# Patient Record
Sex: Male | Born: 1935 | Race: White | Hispanic: No | Marital: Married | Smoking: Former smoker
Health system: Southern US, Community
[De-identification: ages and names within clinical notes are randomized; demographics above are authoritative.]

## PROBLEM LIST (undated history)

## (undated) DIAGNOSIS — E119 Type 2 diabetes mellitus without complications: Secondary | ICD-10-CM

## (undated) DIAGNOSIS — E785 Hyperlipidemia, unspecified: Secondary | ICD-10-CM

## (undated) DIAGNOSIS — I10 Essential (primary) hypertension: Secondary | ICD-10-CM

## (undated) HISTORY — DX: Type 2 diabetes mellitus without complications: E11.9

## (undated) HISTORY — DX: Essential (primary) hypertension: I10

## (undated) HISTORY — PX: OTHER SURGICAL HISTORY: SHX169

## (undated) HISTORY — DX: Hyperlipidemia, unspecified: E78.5

---

## 1999-07-13 HISTORY — PX: COLECTOMY: SHX59

## 2013-04-11 LAB — CHG GLYCOSYLATED HEMOGLOBIN TEST: A1c: 6.5

## 2013-11-01 LAB — CHG GLYCOSYLATED HEMOGLOBIN TEST: A1c: 6.8

## 2014-05-03 LAB — BASIC METABOLIC PANEL
BUN: 21 mg/dL (ref 4–21)
Creatinine: 1.6 mg/dL — AB (ref <=1.3)
Glucose: 130 mg/dL
Potassium: 4.9 mmol/L (ref 3.4–5.3)
Sodium: 141 mmol/L (ref 137–147)

## 2014-05-03 LAB — HEPATIC FUNCTION PANEL
ALT: 23 U/L (ref 10–40)
AST: 20 U/L (ref 14–40)

## 2014-05-03 LAB — COMPREHENSIVE METABOLIC PANEL: CALCIUM: 9.7 mg/dL

## 2014-11-07 LAB — CHG GLYCOSYLATED HEMOGLOBIN TEST: A1C: 9.1

## 2015-09-09 LAB — LIPID PANEL
CHOLESTEROL: 137 mg/dL (ref 0–200)
HDL: 32 mg/dL — AB (ref 35–70)
LDL CALC: 63 mg/dL
TRIGLYCERIDES: 212 mg/dL — AB (ref 40–160)

## 2015-09-09 LAB — HEMOGLOBIN A1C: Hemoglobin A1C: 9.1

## 2015-09-09 LAB — CBC AND DIFFERENTIAL
HEMOGLOBIN: 16.3 g/dL (ref 13.5–17.5)
PLATELETS: 147 10*3/uL — AB (ref 150–399)
WBC: 6.1 10*3/mL

## 2015-09-09 LAB — TSH: TSH: 1.33 u[IU]/mL (ref 0.41–5.90)

## 2015-09-10 ENCOUNTER — Encounter: Payer: Self-pay | Admitting: Family Medicine

## 2015-09-10 ENCOUNTER — Ambulatory Visit (INDEPENDENT_AMBULATORY_CARE_PROVIDER_SITE_OTHER): Payer: Medicare Other | Admitting: Family Medicine

## 2015-09-10 VITALS — BP 97/65 | HR 45 | Ht 65.75 in | Wt 197.0 lb

## 2015-09-10 DIAGNOSIS — N183 Chronic kidney disease, stage 3 unspecified: Secondary | ICD-10-CM | POA: Insufficient documentation

## 2015-09-10 DIAGNOSIS — E785 Hyperlipidemia, unspecified: Secondary | ICD-10-CM | POA: Diagnosis not present

## 2015-09-10 DIAGNOSIS — I10 Essential (primary) hypertension: Secondary | ICD-10-CM

## 2015-09-10 DIAGNOSIS — M1990 Unspecified osteoarthritis, unspecified site: Secondary | ICD-10-CM | POA: Insufficient documentation

## 2015-09-10 DIAGNOSIS — E1165 Type 2 diabetes mellitus with hyperglycemia: Secondary | ICD-10-CM | POA: Insufficient documentation

## 2015-09-10 DIAGNOSIS — E1122 Type 2 diabetes mellitus with diabetic chronic kidney disease: Secondary | ICD-10-CM | POA: Diagnosis not present

## 2015-09-10 DIAGNOSIS — IMO0002 Reserved for concepts with insufficient information to code with codable children: Secondary | ICD-10-CM | POA: Insufficient documentation

## 2015-09-10 LAB — BASIC METABOLIC PANEL
BUN: 22 mg/dL (ref 7–25)
CO2: 24 mmol/L (ref 20–31)
Calcium: 9.2 mg/dL (ref 8.6–10.3)
Chloride: 105 mmol/L (ref 98–110)
Creat: 1.64 mg/dL — ABNORMAL HIGH (ref 0.70–1.11)
Glucose, Bld: 134 mg/dL — ABNORMAL HIGH (ref 65–99)
Potassium: 4.6 mmol/L (ref 3.5–5.3)
Sodium: 139 mmol/L (ref 135–146)

## 2015-09-10 LAB — HEMOGLOBIN A1C
Hgb A1c MFr Bld: 7.2 % — ABNORMAL HIGH (ref <5.7)
Mean Plasma Glucose: 160 mg/dL — ABNORMAL HIGH (ref <117)

## 2015-09-10 MED ORDER — VITAMIN D (ERGOCALCIFEROL) 1.25 MG (50000 UNIT) PO CAPS: 50000.0000 [IU] | ORAL_CAPSULE | ORAL | Status: DC

## 2015-09-10 MED ORDER — INSULIN GLARGINE 100 UNIT/ML SOLOSTAR PEN: 42.0000 [IU] | PEN_INJECTOR | Freq: Every day | SUBCUTANEOUS | Status: DC

## 2015-09-10 MED ORDER — TRAMADOL HCL 50 MG PO TABS: 50.0000 mg | ORAL_TABLET | Freq: Three times a day (TID) | ORAL | Status: DC | PRN

## 2015-09-10 MED ORDER — GLYBURIDE 5 MG PO TABS: 10.0000 mg | ORAL_TABLET | Freq: Every day | ORAL | Status: DC

## 2015-09-10 MED ORDER — PRAVASTATIN SODIUM 40 MG PO TABS: 40.0000 mg | ORAL_TABLET | Freq: Every day | ORAL | Status: DC

## 2015-09-10 NOTE — Progress Notes (Signed)
CC: Matthew Coffey is a 81 y.o. male is here for Establish Care   Subjective: HPI:  Very pleasant 81 year old here to establish care  Reports a history of type 2 diabetes for an unknown matter of years. He is currently taking glyburide and Lantus without hypoglycemic episodes. No outside blood sugars to report other than a normal A1c 3 months ago.  Has a history of hyperlipidemia currently taking pravastatin on a daily basis. No right upper quadrant pain or myalgias.  History of essential hypertension currently taking lisinopril on a daily basis. When he checks his blood pressure at home is always in the normotensive range.  History of chronic kidney disease with most recent creatinine of 1.54 when checked in July of this year. Denies edema  His only complaint is a history of osteoarthritis in the hands and knees for matter of years. He is unable to take nonsteroidal anti-inflammatories due to his kidney disease. He wants to know if there is something else he can take to help with this pain. Mild in severity but present on a daily basis and interfering with his quality of life.  Review of Systems - General ROS: negative for - chills, fever, night sweats, weight gain or weight loss Ophthalmic ROS: negative for - decreased vision Psychological ROS: negative for - anxiety or depression ENT ROS: negative for - hearing change, nasal congestion, tinnitus or allergies Hematological and Lymphatic ROS: negative for - bleeding problems, bruising or swollen lymph nodes Breast ROS: negative Respiratory ROS: no cough, shortness of breath, or wheezing Cardiovascular ROS: no chest pain or dyspnea on exertion Gastrointestinal ROS: no abdominal pain, change in bowel habits, or black or bloody stools Genito-Urinary ROS: negative for - genital discharge, genital ulcers, incontinence or abnormal bleeding from genitals Musculoskeletal ROS: negative for - joint pain or muscle pain other than that described  above Neurological ROS: negative for - headaches or memory loss Dermatological ROS: negative for lumps, mole changes, rash and skin lesion changes  Past Medical History  Diagnosis Date  . Hyperlipidemia   . Hypertension   . Diabetes mellitus without complication (HCC)     No past surgical history on file. Family History  Problem Relation Age of Onset  . Heart attack Mother     Social History   Social History  . Marital Status: Married    Spouse Name: N/A  . Number of Children: N/A  . Years of Education: N/A   Occupational History  . Not on file.   Social History Main Topics  . Smoking status: Former Smoker    Quit date: 10/12/1973  . Smokeless tobacco: Never Used  . Alcohol Use: No  . Drug Use: No  . Sexual Activity: Not on file   Other Topics Concern  . Not on file   Social History Narrative  . No narrative on file     Objective: BP 97/65 mmHg  Pulse 45  Ht 5' 5.75" (1.67 m)  Wt 197 lb (89.359 kg)  BMI 32.04 kg/m2  Vital signs reviewed. General: Alert and Oriented, No Acute Distress HEENT: Pupils equal, round, reactive to light. Conjunctivae clear.  External ears unremarkable.  Moist mucous membranes. Lungs: Clear and comfortable work of breathing, speaking in full sentences without accessory muscle use. Cardiac: Regular rate and rhythm.  Neuro: CN II-XII grossly intact, gait normal. Extremities: No peripheral edema.  Strong peripheral pulses.  Mental Status: No depression, anxiety, nor agitation. Logical though process. Skin: Warm and dry. Assessment & Plan: Matthew Coffey was  seen today for establish care.  Diagnoses and all orders for this visit:  Type 2 diabetes mellitus with chronic kidney disease, without long-term current use of insulin, unspecified CKD stage (HCC) -     Hemoglobin A1c -     Basic Metabolic Panel (BMET)  Hyperlipidemia  Essential hypertension, benign  Chronic kidney disease, stage 3  Other orders -     traMADol (ULTRAM) 50 MG  tablet; Take 1 tablet (50 mg total) by mouth every 8 (eight) hours as needed. -     Insulin Glargine (LANTUS SOLOSTAR) 100 UNIT/ML Solostar Pen; Inject 42 Units into the skin daily at 10 pm. -     Vitamin D, Ergocalciferol, (DRISDOL) 50000 UNITS CAPS capsule; Take 1 capsule (50,000 Units total) by mouth every 7 (seven) days. Recheck Vitamin D in 3 Months -     pravastatin (PRAVACHOL) 40 MG tablet; Take 1 tablet (40 mg total) by mouth daily. -     glyBURIDE (DIABETA) 5 MG tablet; Take 2 tablets (10 mg total) by mouth daily with breakfast.   Type 2 diabetes: Due for A1c and checking renal function continue Lantus and glyburide pending results Hyperlipidemia: Requesting outside records for most recent lipid panel continue pravastatin pending results review Essential hypertension: He wants stop taking his lisinopril, will stop this temporarily and check his blood pressure and renal function in about a month. Osteoarthritis: Uncontrolled chronic condition begin as needed tramadol   Return in about 3 months (around 12/10/2015) for Sugar Follow Up.

## 2015-09-16 ENCOUNTER — Encounter: Payer: Self-pay | Admitting: Family Medicine

## 2015-09-16 DIAGNOSIS — Z905 Acquired absence of kidney: Secondary | ICD-10-CM | POA: Insufficient documentation

## 2015-11-05 ENCOUNTER — Encounter: Payer: Self-pay | Admitting: Family Medicine

## 2015-12-17 ENCOUNTER — Telehealth: Payer: Self-pay | Admitting: Family Medicine

## 2015-12-17 ENCOUNTER — Ambulatory Visit (INDEPENDENT_AMBULATORY_CARE_PROVIDER_SITE_OTHER): Payer: Medicare Other

## 2015-12-17 DIAGNOSIS — I739 Peripheral vascular disease, unspecified: Secondary | ICD-10-CM

## 2015-12-17 DIAGNOSIS — M25562 Pain in left knee: Secondary | ICD-10-CM | POA: Diagnosis not present

## 2015-12-17 NOTE — Telephone Encounter (Signed)
LEFT knee pain for two weeks

## 2015-12-18 ENCOUNTER — Telehealth: Payer: Self-pay | Admitting: Family Medicine

## 2015-12-18 NOTE — Telephone Encounter (Signed)
I spoke with pt as well before lunch

## 2015-12-18 NOTE — Telephone Encounter (Signed)
Dr. Onnie GrahamHommel/[facility] patient called adv that he was seen yesterday and did not receive a call about his xray I read him your notes from this morning and have set him up with Dr. Denyse Amassorey for tomorrow 12/19/15. You do not have to call him back about results. Thanks

## 2015-12-19 ENCOUNTER — Ambulatory Visit (INDEPENDENT_AMBULATORY_CARE_PROVIDER_SITE_OTHER): Payer: Medicare Other | Admitting: Family Medicine

## 2015-12-19 ENCOUNTER — Encounter: Payer: Self-pay | Admitting: Family Medicine

## 2015-12-19 VITALS — BP 152/83 | HR 92 | Wt 206.0 lb

## 2015-12-19 DIAGNOSIS — M25562 Pain in left knee: Secondary | ICD-10-CM | POA: Diagnosis not present

## 2015-12-19 NOTE — Patient Instructions (Signed)
Thank you for coming in today. Return in 4 weeks or so if not better.  Call or go to the ER if you develop a large red swollen joint with extreme pain or oozing puss.   Arthritis Arthritis is a term that is commonly used to refer to joint pain or joint disease. There are more than 100 types of arthritis. CAUSES The most common cause of this condition is wear and tear of a joint. Other causes include:  Gout.  Inflammation of a joint.  An infection of a joint.  Sprains and other injuries near the joint.  A drug reaction or allergic reaction. In some cases, the cause may not be known. SYMPTOMS The main symptom of this condition is pain in the joint with movement. Other symptoms include:  Redness, swelling, or stiffness at a joint.  Warmth coming from the joint.  Fever.  Overall feeling of illness. DIAGNOSIS This condition may be diagnosed with a physical exam and tests, including:  Blood tests.  Urine tests.  Imaging tests, such as MRI, X-rays, or a CT scan. Sometimes, fluid is removed from a joint for testing. TREATMENT Treatment for this condition may involve:  Treatment of the cause, if it is known.  Rest.  Raising (elevating) the joint.  Applying cold or hot packs to the joint.  Medicines to improve symptoms and reduce inflammation.  Injections of a steroid such as cortisone into the joint to help reduce pain and inflammation. Depending on the cause of your arthritis, you may need to make lifestyle changes to reduce stress on your joint. These changes may include exercising more and losing weight. HOME CARE INSTRUCTIONS Medicines  Take over-the-counter and prescription medicines only as told by your health care provider.  Do not take aspirin to relieve pain if gout is suspected. Activities  Rest your joint if told by your health care provider. Rest is important when your disease is active and your joint feels painful, swollen, or stiff.  Avoid  activities that make the pain worse. It is important to balance activity with rest.  Exercise your joint regularly with range-of-motion exercises as told by your health care provider. Try doing low-impact exercise, such as:  Swimming.  Water aerobics.  Biking.  Walking. Joint Care  If your joint is swollen, keep it elevated if told by your health care provider.  If your joint feels stiff in the morning, try taking a warm shower.  If directed, apply heat to the joint. If you have diabetes, do not apply heat without permission from your health care provider.  Put a towel between the joint and the hot pack or heating pad.  Leave the heat on the area for 20-30 minutes.  If directed, apply ice to the joint:  Put ice in a plastic bag.  Place a towel between your skin and the bag.  Leave the ice on for 20 minutes, 2-3 times per day.  Keep all follow-up visits as told by your health care provider. This is important. SEEK MEDICAL CARE IF:  The pain gets worse.  You have a fever. SEEK IMMEDIATE MEDICAL CARE IF:  You develop severe joint pain, swelling, or redness.  Many joints become painful and swollen.  You develop severe back pain.  You develop severe weakness in your leg.  You cannot control your bladder or bowels.   This information is not intended to replace advice given to you by your health care provider. Make sure you discuss any questions you have  with your health care provider.   Document Released: 11/05/2004 Document Revised: 06/19/2015 Document Reviewed: 12/24/2014 Elsevier Interactive Patient Education [employer].

## 2015-12-19 NOTE — Progress Notes (Signed)
Matthew Coffey is a 82 y.o. male who presents to [facility] today for left knee pain. Patient notes a 3 to four-week history of left knee pain. He denies any injury. No fevers chills nausea vomiting or diarrhea. He's tried some over-the-counter medicines which help a bit. His PCP obtained and a x-ray which showed some moderate DJD. Pain is located at the medial aspect of the knee and is worse with activity.   Past Medical History  Diagnosis Date  . Hyperlipidemia   . Hypertension   . Diabetes mellitus without complication (HCC)    No past surgical history on file. Social History  Substance Use Topics  . Smoking status: Former Smoker    Quit date: 10/12/1973  . Smokeless tobacco: Never Used  . Alcohol Use: No   family history includes Heart attack in his mother.  ROS:  No headache, visual changes, nausea, vomiting, diarrhea, constipation, dizziness, abdominal pain, skin rash, fevers, chills, night sweats, weight loss, swollen lymph nodes, body aches, joint swelling, muscle aches, chest pain, shortness of breath, mood changes, visual or auditory hallucinations.    Medications: Current Outpatient Prescriptions  Medication Sig Dispense Refill  . glyBURIDE (DIABETA) 5 MG tablet Take 2 tablets (10 mg total) by mouth daily with breakfast. 120 tablet 1  . Insulin Glargine (LANTUS SOLOSTAR) 100 UNIT/ML Solostar Pen Inject 42 Units into the skin daily at 10 pm. 5 pen 4  . pravastatin (PRAVACHOL) 40 MG tablet Take 1 tablet (40 mg total) by mouth daily. 90 tablet 3  . traMADol (ULTRAM) 50 MG tablet Take 1 tablet (50 mg total) by mouth every 8 (eight) hours as needed. 30 tablet 0  . Vitamin D, Ergocalciferol, (DRISDOL) 50000 UNITS CAPS capsule Take 1 capsule (50,000 Units total) by mouth every 7 (seven) days. Recheck Vitamin D in 3 Months 12 capsule 0   No current facility-administered medications for this visit.   No Known Allergies   Exam:    BP 152/83 mmHg  Pulse 92  Wt 206 lb (93.441 kg) General: Well Developed, well nourished, and in no acute distress.  Neuro/Psych: Alert and oriented x3, extra-ocular muscles intact, able to move all 4 extremities, sensation grossly intact. Skin: Warm and dry, no rashes noted.  Respiratory: Not using accessory muscles, speaking in full sentences, trachea midline.  Cardiovascular: Pulses palpable, no extremity edema. Abdomen: Does not appear distended. MSK: Left knee is relatively normal appearing. Mild effusion.  Nontender.  Normal motion. Stable ligamentous exam. Negative McMurray's testing.  Procedure: Real-time Ultrasound Guided Injection of left knee  Device: GE Logiq E  Images permanently stored and available for review in the ultrasound unit. Verbal informed consent obtained. Discussed risks and benefits of procedure. Warned about infection bleeding damage to structures skin hypopigmentation and fat atrophy among others. Patient expresses understanding and agreement Time-out conducted.  Noted no overlying erythema, induration, or other signs of local infection.  Skin prepped in a sterile fashion.  Local anesthesia: Topical Ethyl chloride.  With sterile technique and under real time ultrasound guidance: 80 mg of Kenalog and 4 mL of Marcaine injected easily.  Completed without difficulty  Pain immediately resolved suggesting accurate placement of the medication.  Advised to call if fevers/chills, erythema, induration, drainage, or persistent bleeding.  Images permanently stored and available for review in the ultrasound unit.  Impression: Technically successful ultrasound guided injection.    No results found for this or any previous visit (from the past 24 hour(s)). Dg Knee  Complete 4 Views Left  12/17/2015  CLINICAL DATA:  Left knee pain. No known injury. Initial evaluation . EXAM: LEFT KNEE - COMPLETE 4+ VIEW COMPARISON:  No recent prior. FINDINGS: Twisted lateral  femoral condyle most likely from old ligamentous injury. Adjacent soft tissue calcification, most likely dystrophic. No other focal bony abnormality identified. Mild patellofemoral and medial compartment degenerative change. Small knee joint effusion cannot be excluded. Peripheral vascular calcification . IMPRESSION: 1. Small bony density noted adjacent to the lateral femoral condyle most likely from old ligamentous injury. Adjacent soft tissue calcification most likely dystrophic . 2. Mild patellofemoral and medial compartment degenerative change. No acute bony abnormality . Small knee joint effusion cannot be excluded. 3.  Peripheral vascular disease. Electronically Signed   By: Maisie Fushomas  Register   On: 12/17/2015 17:09     Please see individual assessment and plan sections.

## 2015-12-19 NOTE — Assessment & Plan Note (Signed)
Due to DJD. Steroid injection today. Return in a few weeks if not better.

## 2016-02-03 ENCOUNTER — Telehealth: Payer: Self-pay

## 2016-02-03 ENCOUNTER — Other Ambulatory Visit: Payer: Self-pay

## 2016-02-03 MED ORDER — GLIPIZIDE 5 MG PO TABS: 5.0000 mg | ORAL_TABLET | Freq: Two times a day (BID) | ORAL | Status: DC

## 2016-02-03 MED ORDER — INSULIN PEN NEEDLE 31G X 5 MM MISC: Status: DC

## 2016-02-03 MED ORDER — AMBULATORY NON FORMULARY MEDICATION: Status: DC

## 2016-02-03 MED ORDER — PRAVASTATIN SODIUM 40 MG PO TABS: 40.0000 mg | ORAL_TABLET | Freq: Every day | ORAL | Status: DC

## 2016-02-03 NOTE — Telephone Encounter (Signed)
rx faxed

## 2016-02-03 NOTE — Telephone Encounter (Signed)
[place], Rx placed in in-box ready for pickup/faxing.  

## 2016-02-03 NOTE — Telephone Encounter (Signed)
Pt is asking for needles and test strips (one touch ultra) to go to [facility].

## 2016-02-17 ENCOUNTER — Telehealth: Payer: Self-pay | Admitting: Family Medicine

## 2016-02-17 MED ORDER — INSULIN GLARGINE 100 UNIT/ML SOLOSTAR PEN: 42.0000 [IU] | PEN_INJECTOR | Freq: Every day | SUBCUTANEOUS | Status: DC

## 2016-02-17 NOTE — Telephone Encounter (Signed)
Refill req 

## 2016-04-07 ENCOUNTER — Ambulatory Visit (INDEPENDENT_AMBULATORY_CARE_PROVIDER_SITE_OTHER): Payer: Medicare Other | Admitting: Family Medicine

## 2016-04-07 ENCOUNTER — Encounter: Payer: Self-pay | Admitting: Family Medicine

## 2016-04-07 VITALS — BP 125/76 | HR 90 | Wt 202.0 lb

## 2016-04-07 DIAGNOSIS — E1122 Type 2 diabetes mellitus with diabetic chronic kidney disease: Secondary | ICD-10-CM

## 2016-04-07 DIAGNOSIS — I10 Essential (primary) hypertension: Secondary | ICD-10-CM | POA: Diagnosis not present

## 2016-04-07 DIAGNOSIS — L309 Dermatitis, unspecified: Secondary | ICD-10-CM

## 2016-04-07 LAB — POCT GLYCOSYLATED HEMOGLOBIN (HGB A1C): Hemoglobin A1C: 7.7

## 2016-04-07 MED ORDER — VITAMIN D (ERGOCALCIFEROL) 1.25 MG (50000 UNIT) PO CAPS: 50000.0000 [IU] | ORAL_CAPSULE | ORAL | Status: DC

## 2016-04-07 MED ORDER — TRIAMCINOLONE ACETONIDE 0.1 % EX CREA: TOPICAL_CREAM | CUTANEOUS | Status: DC

## 2016-04-07 NOTE — Progress Notes (Signed)
CC: Matthew PalmerClaude Coffey is a 82 y.o. male is here for No chief complaint on file.   Subjective: HPI:  Follow-up type 2 diabetes: No outside blood sugars to report. He is taking Lantus nightly at a dose of 42 units. He is also taking glipizide with 100% compliance. Denies polyuria polyphagia polydipsia or vision disturbance. He is yet to find an eye doctor in the [place] area.  Follow-up essential hypertension: Currently not taking any anti-hyperglycemics. He denies any chest pain shortness of breath orthopnea nor peripheral edema.  His only real complaint today is bilateral foot tenderness. It's localized to the skin. It seems to be worse at areas that are flaking, dry and cracking. He denies any fevers, chills or skin changes elsewhere. No interventions as of yet symptoms were present for matter months     Review Of Systems Outlined In HPI  Past Medical History  Diagnosis Date  . Hyperlipidemia   . Hypertension   . Diabetes mellitus without complication (HCC)     No past surgical history on file. Family History  Problem Relation Age of Onset  . Heart attack Mother     Social History   Social History  . Marital Status: Married    Spouse Name: N/A  . Number of Children: N/A  . Years of Education: N/A   Occupational History  . Not on file.   Social History Main Topics  . Smoking status: Former Smoker    Quit date: 10/12/1973  . Smokeless tobacco: Never Used  . Alcohol Use: No  . Drug Use: No  . Sexual Activity: Not on file   Other Topics Concern  . Not on file   Social History Narrative     Objective: BP 125/76 mmHg  Pulse 90  Wt 202 lb (91.627 kg)  General: Alert and Oriented, No Acute Distress HEENT: Pupils equal, round, reactive to light. Conjunctivae clear.  Moist mucous membranes Lungs: Clear to auscultation bilaterally, no wheezing/ronchi/rales.  Comfortable work of breathing. Good air movement. Cardiac: Regular rate and rhythm. Normal S1/S2.  No  murmurs, rubs, nor gallops.   Extremities: No peripheral edema.  Strong peripheral pulses.  Mental Status: No depression, anxiety, nor agitation. Skin: Warm and dry. The bottom and sides of both feet are moderately dry with some mild flaking and cracking but no signs of infection.  Assessment & Plan: Diagnoses and all orders for this visit:  Type 2 diabetes mellitus with chronic kidney disease, without long-term current use of insulin, unspecified CKD stage (HCC) -     POCT glycosylated hemoglobin (Hb A1C)  Essential hypertension, benign  Dermatitis of foot -     triamcinolone cream (KENALOG) 0.1 %; Apply to FEET  twice a day for up to two weeks, avoid face.  Other orders -     Vitamin D, Ergocalciferol, (DRISDOL) 50000 units CAPS capsule; Take 1 capsule (50,000 Units total) by mouth every 7 (seven) days.   Type 2 diabetes: A1c of 7.7, controlled, goal A1c of less than 8. Continue glipizide and Lantus Essential hypertension: Controlled with diet and sodium restriction Dermatitis of the foot: Start triamcinolone cream for the next 2 weeks. He would like a refill on vitamin D, he tells me he has to take this on a weekly basis to keep his levels up.    Return in about 3 months (around 07/08/2016) for Sugar.

## 2016-05-22 ENCOUNTER — Telehealth: Payer: Self-pay | Admitting: Family Medicine

## 2016-05-22 MED ORDER — MOMETASONE FUROATE 0.1 % EX CREA: 1.0000 "application " | TOPICAL_CREAM | Freq: Every day | CUTANEOUS | 1 refills | Status: DC

## 2016-05-22 NOTE — Telephone Encounter (Signed)
Refill req 

## 2016-06-26 ENCOUNTER — Other Ambulatory Visit: Payer: Self-pay

## 2016-06-26 ENCOUNTER — Telehealth: Payer: Self-pay

## 2016-06-26 MED ORDER — LANTUS SOLOSTAR 100 UNIT/ML ~~LOC~~ SOPN: 42.0000 [IU] | PEN_INJECTOR | Freq: Every day | SUBCUTANEOUS | 0 refills | Status: DC

## 2016-06-26 MED ORDER — LANTUS SOLOSTAR 100 UNIT/ML ~~LOC~~ SOPN: 42.0000 [IU] | PEN_INJECTOR | Freq: Every day | SUBCUTANEOUS | 2 refills | Status: DC

## 2016-06-26 NOTE — Telephone Encounter (Signed)
Rx sent to [facility].  Pt notified and was advised to make follow up appointment. He verbalized understanding.

## 2016-06-26 NOTE — Telephone Encounter (Signed)
Pt called stating that he has an appointment scheduled to switch care [facility] to you on the 27th. He is completely out of Lantus and was expecting a refill for a 3 month supply to be sent to his pharmacy earlier this week. Advised pt that I would discuss matter with you and that while he may be able to get a refill to last until his appointment, there is no gaurentee that he will get a 3 month supply until after his visit. Pt stated that he will be charged $75 for one month and can get 3 months for the same price. Advised pt that this would be discussed and he will be contacted back. Please advise.

## 2016-06-26 NOTE — Telephone Encounter (Signed)
Okay to fill for 3 months, if he doesn't show up for his appointment will not do any further refills

## 2016-07-08 ENCOUNTER — Ambulatory Visit (INDEPENDENT_AMBULATORY_CARE_PROVIDER_SITE_OTHER): Payer: Medicare Other | Admitting: Osteopathic Medicine

## 2016-07-08 ENCOUNTER — Encounter: Payer: Self-pay | Admitting: Osteopathic Medicine

## 2016-07-08 VITALS — BP 120/76 | HR 93 | Ht 68.0 in | Wt 204.0 lb

## 2016-07-08 DIAGNOSIS — R0789 Other chest pain: Secondary | ICD-10-CM

## 2016-07-08 DIAGNOSIS — E1122 Type 2 diabetes mellitus with diabetic chronic kidney disease: Secondary | ICD-10-CM | POA: Diagnosis not present

## 2016-07-08 LAB — POCT GLYCOSYLATED HEMOGLOBIN (HGB A1C): HEMOGLOBIN A1C: 8.4

## 2016-07-08 NOTE — Progress Notes (Signed)
HPI: Matthew Coffey is a 82 y.o. male  who presents to [facility] [place] today, 07/08/16,  for chief complaint of:  Chief Complaint  Patient presents with  . Establish Care    diabetes   Daughter spoke with me privately prior to the visit, she wants to be sure that I ask about some chest pain the patient was experiencing a few days ago.  Diabetes:Lantus 40 units daily, A1C as below. Eats pretty much whatever he wants but no major sweet tooth.   Chest pain: woek up with some chest discomfort Neck before last. It passed on its own. Patient reports occasional short winded on exertion but no chest pain on exertion or at rest. No history of heart problems that he knows of.   Patient is accompanied by daughter and wife who assists with history-taking.   Past medical, surgical, social and family history reviewed: Past Medical History:  Diagnosis Date  . Diabetes mellitus without complication (HCC)   . Hyperlipidemia   . Hypertension    No past surgical history on file. Social History  Substance Use Topics  . Smoking status: Former Smoker    Quit date: 10/12/1973  . Smokeless tobacco: Never Used  . Alcohol use No   Family History  Problem Relation Age of Onset  . Heart attack Mother      Current medication list and allergy/intolerance information reviewed:   Current Outpatient Prescriptions  Medication Sig Dispense Refill  . AMBULATORY NON FORMULARY MEDICATION Glucometer testing strips (One Touch Ultra): Use to check blood sugar up to three times a day. Dx: Insulin Dependent Type 2 Diabetes, E11.9,Z79.4 50 Units 11  . glipiZIDE (GLUCOTROL) 5 MG tablet Take 1 tablet (5 mg total) by mouth 2 (two) times daily before a meal. 180 tablet 1  . Insulin Pen Needle 31G X 5 MM MISC Use fresh needle to inject insulin subcutaneously daily. Dx: Insulin Dependent Type 2 Diabetes E11.9, Z79.4 50 each 11  . LANTUS SOLOSTAR 100 UNIT/ML Solostar Pen Inject 42 Units into  the skin daily. 15 pen 0  . mometasone (ELOCON) 0.1 % cream Apply 1 application topically daily. As needed for skin irritation 45 g 1  . pravastatin (PRAVACHOL) 40 MG tablet Take 1 tablet (40 mg total) by mouth daily. 90 tablet 3  . traMADol (ULTRAM) 50 MG tablet Take 1 tablet (50 mg total) by mouth every 8 (eight) hours as needed. 30 tablet 0  . triamcinolone cream (KENALOG) 0.1 % Apply to FEET  twice a day for up to two weeks, avoid face. 80 g 0  . Vitamin D, Ergocalciferol, (DRISDOL) 50000 units CAPS capsule Take 1 capsule (50,000 Units total) by mouth every 7 (seven) days. 12 capsule 1   No current facility-administered medications for this visit.    No Known Allergies    Review of Systems:  Constitutional:   No significant fatigue.   HEENT: No  headache, no vision change,  Cardiac: +chest pain, No  pressure, No palpitations, No  Orthopnea  Respiratory:  No  shortness of breath. No  Cough  Gastrointestinal: No  abdominal pain, No  nausea,    Musculoskeletal: No new myalgia/arthralgia  Neurologic: No  weakness, No  dizziness,  Psychiatric: No  concerns with depression, No  concerns with anxiety  Exam:  BP 120/76   Pulse 93   Ht 5\' 8"  (1.727 m)   Wt 204 lb (92.5 kg)   BMI 31.02 kg/m   Constitutional: VS see above.  General Appearance: alert, well-developed, well-nourished, NAD  Eyes: Normal lids and conjunctive, non-icteric sclera  Ears, Nose, Mouth, Throat: MMM, Normal external inspection ears/nares/mouth/lips/gums. TM normal bilaterally. Pharynx/tonsils no erythema, no exudate. Nasal mucosa normal.   Neck: No masses, trachea midline. No thyroid enlargement. No tenderness/mass appreciated. No lymphadenopathy  Respiratory: Normal respiratory effort. no wheeze, no rhonchi, no rales  Cardiovascular: S1/S2 normal, no murmur, no rub/gallop auscultated. RRR. No lower extremity edema.   Gastrointestinal: Nontender, no masses. No hepatomegaly, no splenomegaly. No hernia  appreciated. Bowel sounds normal. Rectal exam deferred.   Musculoskeletal: Gait normal. No clubbing/cyanosis of digits.   Neurological:Normal balance/coordination. No tremor.   Skin: warm, dry, intact. No concerning nevi or subq nodules on limited exam.    Psychiatric: Normal judgment/insight. Normal mood and affect. Oriented x3.    Results for orders placed or performed in visit on 07/08/16 (from the past 72 hour(s))  POCT HgB A1C     Status: None   Collection Time: 07/08/16  2:12 PM  Result Value Ref Range   Hemoglobin A1C 8.4       ASSESSMENT/PLAN:    Type 2 diabetes mellitus with chronic kidney disease, without long-term current use of insulin, unspecified CKD stage (HCC) - Plan: POCT HgB A1C, Microalbumin, urine, CBC with Differential/Platelet, COMPLETE METABOLIC PANEL WITH GFR, Lipid panel, TSH  Chest discomfort - no prior EKG available for comparison, no chest pain/angina sometimes otherwise. EKG shows some right bundle branch block, possible old inferior infarcts with left axis deviation, but no ST or T changes concerning for acute ischemia/infarct. ER precautions were reviewed  Patient Instructions  Plan for diabetes:  Increase insulin to 43 units daily, can increase up to 45 as well as sugars are staying above 120 fasting morning levels.  We are getting other blood work today for routine screening.    Visit summary with medication list and pertinent instructions was printed for patient to review. All questions at time of visit were answered - patient instructed to contact office with any additional concerns. ER/RTC precautions were reviewed with the patient. Follow-up plan: Return in about 3 months (around 10/07/2016) for diabetes followup.

## 2016-07-08 NOTE — Patient Instructions (Signed)
Plan for diabetes:  Increase insulin to 43 units daily, can increase up to 45 as well as sugars are staying above 120 fasting morning levels.  We are getting other blood work today for routine screening.

## 2016-07-09 LAB — LIPID PANEL
CHOL/HDL RATIO: 3.5 ratio (ref <=5.0)
Cholesterol: 123 mg/dL — ABNORMAL LOW (ref 125–200)
HDL: 35 mg/dL — AB (ref >=40)
LDL Cholesterol: 57 mg/dL (ref <130)
TRIGLYCERIDES: 155 mg/dL — AB (ref <150)
VLDL: 31 mg/dL — ABNORMAL HIGH (ref <30)

## 2016-07-09 LAB — CBC WITH DIFFERENTIAL/PLATELET
BASOS ABS: 0 {cells}/uL (ref 0–200)
Basophils Relative: 0 %
EOS PCT: 4 %
Eosinophils Absolute: 300 cells/uL (ref 15–500)
HCT: 48.3 % (ref 38.5–50.0)
HEMOGLOBIN: 16.4 g/dL (ref 13.2–17.1)
LYMPHS ABS: 2175 {cells}/uL (ref 850–3900)
Lymphocytes Relative: 29 %
MCH: 30.7 pg (ref 27.0–33.0)
MCHC: 34 g/dL (ref 32.0–36.0)
MCV: 90.4 fL (ref 80.0–100.0)
MONOS PCT: 9 %
MPV: 10.7 fL (ref 7.5–12.5)
Monocytes Absolute: 675 cells/uL (ref 200–950)
NEUTROS ABS: 4350 {cells}/uL (ref 1500–7800)
NEUTROS PCT: 58 %
PLATELETS: 146 10*3/uL (ref 140–400)
RBC: 5.34 MIL/uL (ref 4.20–5.80)
RDW: 14.1 % (ref 11.0–15.0)
WBC: 7.5 10*3/uL (ref 3.8–10.8)

## 2016-07-09 LAB — COMPLETE METABOLIC PANEL WITH GFR
ALBUMIN: 4.1 g/dL (ref 3.6–5.1)
ALK PHOS: 44 U/L (ref 40–115)
ALT: 43 U/L (ref 9–46)
AST: 29 U/L (ref 10–35)
BILIRUBIN TOTAL: 0.8 mg/dL (ref 0.2–1.2)
BUN: 20 mg/dL (ref 7–25)
CO2: 29 mmol/L (ref 20–31)
Calcium: 9.6 mg/dL (ref 8.6–10.3)
Chloride: 103 mmol/L (ref 98–110)
Creat: 1.64 mg/dL — ABNORMAL HIGH (ref 0.70–1.11)
GFR, EST AFRICAN AMERICAN: 44 mL/min — AB (ref >=60)
GFR, EST NON AFRICAN AMERICAN: 38 mL/min — AB (ref >=60)
Glucose, Bld: 223 mg/dL — ABNORMAL HIGH (ref 65–99)
POTASSIUM: 4.7 mmol/L (ref 3.5–5.3)
Sodium: 140 mmol/L (ref 135–146)
TOTAL PROTEIN: 6.7 g/dL (ref 6.1–8.1)

## 2016-07-09 LAB — TSH: TSH: 1.56 mIU/L (ref 0.40–4.50)

## 2016-07-11 DIAGNOSIS — I214 Non-ST elevation (NSTEMI) myocardial infarction: Secondary | ICD-10-CM | POA: Insufficient documentation

## 2016-07-11 DIAGNOSIS — D696 Thrombocytopenia, unspecified: Secondary | ICD-10-CM | POA: Insufficient documentation

## 2016-07-11 DIAGNOSIS — I249 Acute ischemic heart disease, unspecified: Secondary | ICD-10-CM | POA: Insufficient documentation

## 2016-07-13 NOTE — Addendum Note (Signed)
Addended by: Wyline BeadyMCCRIMMON, ANDREA C on: 07/13/2016 04:14 PM   Modules accepted: Orders

## 2016-07-16 ENCOUNTER — Telehealth: Payer: Self-pay

## 2016-07-16 NOTE — Telephone Encounter (Signed)
Patient called requested a refill for Metformin 1000 mcg. Please advise because I don't see this medication in patient chart. Kamareon Sciandra,CMA

## 2016-07-16 NOTE — Telephone Encounter (Signed)
You have, I don't see this anywhere on his list or on his previous medications. Is he sure he does not need glyburide? Either way, he probably should be on metformin but that he told me at his visit that this medicine caused him problems in the past. Let's get some clarification on this.

## 2016-07-17 NOTE — Telephone Encounter (Signed)
It was for his wife which was taken care of. Bralyn Espino,CMA

## 2016-07-21 ENCOUNTER — Encounter: Payer: Self-pay | Admitting: Osteopathic Medicine

## 2016-07-21 ENCOUNTER — Ambulatory Visit (INDEPENDENT_AMBULATORY_CARE_PROVIDER_SITE_OTHER): Payer: Medicare Other | Admitting: Osteopathic Medicine

## 2016-07-21 VITALS — BP 115/59 | HR 76 | Ht 68.0 in | Wt 202.0 lb

## 2016-07-21 DIAGNOSIS — N183 Chronic kidney disease, stage 3 unspecified: Secondary | ICD-10-CM

## 2016-07-21 DIAGNOSIS — I2510 Atherosclerotic heart disease of native coronary artery without angina pectoris: Secondary | ICD-10-CM

## 2016-07-21 DIAGNOSIS — I10 Essential (primary) hypertension: Secondary | ICD-10-CM

## 2016-07-21 DIAGNOSIS — Z794 Long term (current) use of insulin: Secondary | ICD-10-CM

## 2016-07-21 DIAGNOSIS — IMO0002 Reserved for concepts with insufficient information to code with codable children: Secondary | ICD-10-CM

## 2016-07-21 DIAGNOSIS — E1159 Type 2 diabetes mellitus with other circulatory complications: Secondary | ICD-10-CM | POA: Diagnosis not present

## 2016-07-21 DIAGNOSIS — E1165 Type 2 diabetes mellitus with hyperglycemia: Secondary | ICD-10-CM

## 2016-07-21 MED ORDER — ATORVASTATIN CALCIUM 80 MG PO TABS: 80.0000 mg | ORAL_TABLET | Freq: Every day | ORAL | 3 refills | Status: DC

## 2016-07-21 NOTE — Patient Instructions (Addendum)
We are going to reduce the dose of the metoprolol, use up the tablets you have, half tablets and due for new prescription I will call in lower dose medication.  To keep your heart functioning as well as you cannula on the following medications: Metoprolol - reduce his blood pressure and stress on the heart Isosorbide - helps dilated blood vessels to increase blood flow to the heart tissue Atorvastatin - cholesterol medicine that helps prevent arterial plaque from forming and can stabilize or reduce plaque that is already present Aspirin - mild blood thinner to help deliver blood to the heart tissue Nitroglycerin tablets - take as needed for severe chest pain  See below for dietary modifications to promote heart health and help control diabetes.    Diabetes Mellitus and Food It is important for you to manage your blood sugar (glucose) level. Your blood glucose level can be greatly affected by what you eat. Eating healthier foods in the appropriate amounts throughout the day at about the same time each day will help you control your blood glucose level. It can also help slow or prevent worsening of your diabetes mellitus. Healthy eating may even help you improve the level of your blood pressure and reach or maintain a healthy weight.  General recommendations for healthful eating and cooking habits include:  Eating meals and snacks regularly. Avoid going long periods of time without eating to lose weight.  Eating a diet that consists mainly of plant-based foods, such as fruits, vegetables, nuts, legumes, and whole grains.  Using low-heat cooking methods, such as baking, instead of high-heat cooking methods, such as deep frying. Work with your dietitian to make sure you understand how to use the Nutrition Facts information on food labels. HOW CAN FOOD AFFECT ME? Carbohydrates Carbohydrates affect your blood glucose level more than any other type of food. Your dietitian will help you determine  how many carbohydrates to eat at each meal and teach you how to count carbohydrates. Counting carbohydrates is important to keep your blood glucose at a healthy level, especially if you are using insulin or taking certain medicines for diabetes mellitus. Alcohol Alcohol can cause sudden decreases in blood glucose (hypoglycemia), especially if you use insulin or take certain medicines for diabetes mellitus. Hypoglycemia can be a life-threatening condition. Symptoms of hypoglycemia (sleepiness, dizziness, and disorientation) are similar to symptoms of having too much alcohol.  If your health care provider has given you approval to drink alcohol, do so in moderation and use the following guidelines:  Women should not have more than one drink per day, and men should not have more than two drinks per day. One drink is equal to:  12 oz of beer.  5 oz of wine.  1 oz of hard liquor.  Do not drink on an empty stomach.  Keep yourself hydrated. Have water, diet soda, or unsweetened iced tea.  Regular soda, juice, and other mixers might contain a lot of carbohydrates and should be counted. WHAT FOODS ARE NOT RECOMMENDED? As you make food choices, it is important to remember that all foods are not the same. Some foods have fewer nutrients per serving than other foods, even though they might have the same number of calories or carbohydrates. It is difficult to get your body what it needs when you eat foods with fewer nutrients. Examples of foods that you should avoid that are high in calories and carbohydrates but low in nutrients include:  Trans fats (most processed foods list trans fats  on the Nutrition Facts label).  Regular soda.  Juice.  Candy.  Sweets, such as cake, pie, doughnuts, and cookies.  Fried foods. WHAT FOODS CAN I EAT? Eat nutrient-rich foods, which will nourish your body and keep you healthy. The food you should eat also will depend on several factors, including:  The calories  you need.  The medicines you take.  Your weight.  Your blood glucose level.  Your blood pressure level.  Your cholesterol level. You should eat a variety of foods, including:  Protein.  Lean cuts of meat.  Proteins low in saturated fats, such as fish, egg whites, and beans. Avoid processed meats.  Fruits and vegetables.  Fruits and vegetables that may help control blood glucose levels, such as apples, mangoes, and yams.  Dairy products.  Choose fat-free or low-fat dairy products, such as milk, yogurt, and cheese.  Grains, bread, pasta, and rice.  Choose whole grain products, such as multigrain bread, whole oats, and brown rice. These foods may help control blood pressure.  Fats.  Foods containing healthful fats, such as nuts, avocado, olive oil, canola oil, and fish. DOES EVERYONE WITH DIABETES MELLITUS HAVE THE SAME MEAL PLAN? Because every person with diabetes mellitus is different, there is not one meal plan that works for everyone. It is very important that you meet with a dietitian who will help you create a meal plan that is just right for you.   This information is not intended to replace advice given to you by your health care provider. Make sure you discuss any questions you have with your health care provider.   Document Released: 06/25/2005 Document Revised: 10/19/2014 Document Reviewed: 08/25/2013 Elsevier Interactive Patient Education 2016 [employer].       Mediterranean Diet: Why follow it? Research shows. . Those who follow the Mediterranean diet have a reduced risk of heart disease  . The diet is associated with a reduced incidence of Parkinson's and Alzheimer's diseases . People following the diet may have longer life expectancies and lower rates of chronic diseases  . The Dietary Guidelines for Americans recommends the Mediterranean diet as an eating plan to promote health and prevent disease  What Is the Mediterranean Diet?  . Healthy eating  plan based on typical foods and recipes of Mediterranean-style cooking . The diet is primarily a plant based diet; these foods should make up a majority of meals   Starches - Plant based foods should make up a majority of meals - They are an important sources of vitamins, minerals, energy, antioxidants, and fiber - Choose whole grains, foods high in fiber and minimally processed items  - Typical grain sources include wheat, oats, barley, corn, brown rice, bulgar, farro, millet, polenta, couscous  - Various types of beans include chickpeas, lentils, fava beans, black beans, white beans   Fruits  Veggies - Large quantities of antioxidant rich fruits & veggies; 6 or more servings  - Vegetables can be eaten raw or lightly drizzled with oil and cooked  - Vegetables common to the traditional Mediterranean Diet include: artichokes, arugula, beets, broccoli, brussel sprouts, cabbage, carrots, celery, collard greens, cucumbers, eggplant, kale, leeks, lemons, lettuce, mushrooms, okra, onions, peas, peppers, potatoes, pumpkin, radishes, rutabaga, shallots, spinach, sweet potatoes, turnips, zucchini - Fruits common to the Mediterranean Diet include: apples, apricots, avocados, cherries, clementines, dates, figs, grapefruits, grapes, melons, nectarines, oranges, peaches, pears, pomegranates, strawberries, tangerines  Fats - Replace butter and margarine with healthy oils, such as olive oil, canola oil, and tahini  -  Limit nuts to no more than a handful a day  - Nuts include walnuts, almonds, pecans, pistachios, pine nuts  - Limit or avoid candied, honey roasted or heavily salted nuts - Olives are central to the Mediterranean diet - can be eaten whole or used in a variety of dishes   Meats Protein - Limiting red meat: no more than a few times a month - When eating red meat: choose lean cuts and keep the portion to the size of deck of cards - Eggs: approx. 0 to 4 times a week  - Fish and lean poultry: at least 2  a week  - Healthy protein sources include, chicken, [place], lean beef, lamb - Increase intake of seafood such as tuna, salmon, trout, mackerel, shrimp, scallops - Avoid or limit high fat processed meats such as sausage and bacon  Dairy - Include moderate amounts of low fat dairy products  - Focus on healthy dairy such as fat free yogurt, skim milk, low or reduced fat cheese - Limit dairy products higher in fat such as whole or 2% milk, cheese, ice cream  Alcohol - Moderate amounts of red wine is ok  - No more than 5 oz daily for women (all ages) and men older than age 80  - No more than 10 oz of wine daily for men younger than 5465  Other - Limit sweets and other desserts  - Use herbs and spices instead of salt to flavor foods  - Herbs and spices common to the traditional Mediterranean Diet include: basil, bay leaves, chives, cloves, cumin, fennel, garlic, lavender, marjoram, mint, oregano, parsley, pepper, rosemary, sage, savory, sumac, tarragon, thyme   It's not just a diet, it's a lifestyle:  . The Mediterranean diet includes lifestyle factors typical of those in the region  . Foods, drinks and meals are best eaten with others and savored . Daily physical activity is important for overall good health . This could be strenuous exercise like running and aerobics . This could also be more leisurely activities such as walking, housework, yard-work, or taking the stairs . Moderation is the key; a balanced and healthy diet accommodates most foods and drinks . Consider portion sizes and frequency of consumption of certain foods   Meal Ideas & Options:  . Breakfast:  o Whole wheat toast or whole wheat English muffins with peanut butter & hard boiled egg o Steel cut oats topped with apples & cinnamon and skim milk  o Fresh fruit: banana, strawberries, melon, berries, peaches  o Smoothies: strawberries, bananas, greek yogurt, peanut butter o Low fat greek yogurt with blueberries and granola   o Egg white omelet with spinach and mushrooms o Breakfast couscous: whole wheat couscous, apricots, skim milk, cranberries  . Sandwiches:  o Hummus and grilled vegetables (peppers, zucchini, squash) on whole wheat bread   o Grilled chicken on whole wheat pita with lettuce, tomatoes, cucumbers or tzatziki  o Tuna salad on whole wheat bread: tuna salad made with greek yogurt, olives, red peppers, capers, green onions o Garlic rosemary lamb pita: lamb sauted with garlic, rosemary, salt & pepper; add lettuce, cucumber, greek yogurt to pita - flavor with lemon juice and black pepper  . Seafood:  o Mediterranean grilled salmon, seasoned with garlic, basil, parsley, lemon juice and black pepper o Shrimp, lemon, and spinach whole-grain pasta salad made with low fat greek yogurt  o Seared scallops with lemon orzo  o Seared tuna steaks seasoned salt, pepper, coriander topped with tomato  mixture of olives, tomatoes, olive oil, minced garlic, parsley, green onions and cappers  . Meats:  o Herbed greek chicken salad with kalamata olives, cucumber, feta  o Red bell peppers stuffed with spinach, bulgur, lean ground beef (or lentils) & topped with feta   o Kebabs: skewers of chicken, tomatoes, onions, zucchini, squash  o [place] burgers: made with red onions, mint, dill, lemon juice, feta cheese topped with roasted red peppers . Vegetarian o Cucumber salad: cucumbers, artichoke hearts, celery, red onion, feta cheese, tossed in olive oil & lemon juice  o Hummus and whole grain pita points with a greek salad (lettuce, tomato, feta, olives, cucumbers, red onion) o Lentil soup with celery, carrots made with vegetable broth, garlic, salt and pepper  o Tabouli salad: parsley, bulgur, mint, scallions, cucumbers, tomato, radishes, lemon juice, olive oil, salt and pepper.

## 2016-07-21 NOTE — Progress Notes (Signed)
HPI: Matthew Coffey is a 82 y.o. male  who presents to [facility] today, 07/21/16,  for chief complaint of:  Chief Complaint  Patient presents with  . Hospitalization Follow-up    HEART ATTACK    126/77 on home monitor  . Patient was having some intermittent chest pains, went to emergency room, was admitted for workup for an STEMI. See records reviewed as below. At last visit, patient had complained of 1 episode of chest pain which resolved spontaneously, EKG at that point was not concerning for acute event however patient was advised to follow-up in ER if recurrence happened which is what he did. He denies chest pain at this point, no chest pain at rest or on exertion. He underwent cardiac half and was found to have calcified RCA, not amenable to PCI. Patient is very anxious about the fact that this wasn't able to be fixed with the cast. He has a lot of questions about his medications.  Patient is accompanied by daughter who assists with history-taking.   Hospital records reviewed: "Admit date: 07/11/2016 Discharge date: 07/15/2016  Hospital LOS: 3 days Active Hospital Problems  Diagnosis Date Noted POA  . ACS (acute coronary syndrome) (*) 07/11/2016 Yes  . Acute renal insufficiency 07/11/2016 Yes  . Non-STEMI (non-ST elevated myocardial infarction) (*) 07/11/2016 Yes  . Thrombocytopenia (*) 07/11/2016 Yes  . Hypertension Yes  . Diabetes mellitus (*) Yes   NEW medications  Details  aspirin 81 mg atorvastatin (LIPITOR) 80 mg  isosorbide mononitrate (IMDUR) 30 mg 24 hr tablet metoprolol succinate (TOPROL-XL) 25 mg 24 hr tablet nitroGLYCERIN (NITROSTAT) 0.4 mg SL tablet prn  Hospital Course: Patient presented to the hospital with a history of waxing and waning chest pain that had been ongoing for a week. Once the pain increased in intensity and was associated with nausea and diaphoresis patient came to the ED. His troponin was found to be elevated at  0.692, EKG showed sinus rhythm with first-degree AV block, right bundle branch block and left axis deviation, and Q waves in inferior leads. Patient was given aspirin, nitroglycerin and started on heparin drip.   Patient was taken the cardiac catheterization lab and was found to single vessel disease: a heavily calcified RCA ostial lesion that was not amenable to PCI at this time. Medical therapy was recommend with beta blocker, ASA, statin and imdur. Ambulated today without having angina.  Follow up appointments with [facility] in 2 to 3 weeks and 2 to 3 months with Dr. Lorenso Courier."     Past medical, surgical, social and family history reviewed: Past Medical History:  Diagnosis Date  . Diabetes mellitus without complication (HCC)   . Hyperlipidemia   . Hypertension    No past surgical history on file. Social History  Substance Use Topics  . Smoking status: Former Smoker    Quit date: 10/12/1973  . Smokeless tobacco: Never Used  . Alcohol use No   Family History  Problem Relation Age of Onset  . Heart attack Mother      Current medication list and allergy/intolerance information reviewed:   Current Outpatient Prescriptions  Medication Sig Dispense Refill  . AMBULATORY NON FORMULARY MEDICATION Glucometer testing strips (One Touch Ultra): Use to check blood sugar up to three times a day. Dx: Insulin Dependent Type 2 Diabetes, E11.9,Z79.4 50 Units 11  . glipiZIDE (GLUCOTROL) 5 MG tablet Take 1 tablet (5 mg total) by mouth 2 (two) times daily before a meal. 180 tablet 1  .  Insulin Pen Needle 31G X 5 MM MISC Use fresh needle to inject insulin subcutaneously daily. Dx: Insulin Dependent Type 2 Diabetes E11.9, Z79.4 50 each 11  . LANTUS SOLOSTAR 100 UNIT/ML Solostar Pen Inject 42 Units into the skin daily. 15 pen 0  . mometasone (ELOCON) 0.1 % cream Apply 1 application topically daily. As needed for skin irritation 45 g 1  . pravastatin (PRAVACHOL) 40 MG tablet Take 1 tablet (40 mg total) by mouth  daily. 90 tablet 3  . traMADol (ULTRAM) 50 MG tablet Take 1 tablet (50 mg total) by mouth every 8 (eight) hours as needed. 30 tablet 0  . triamcinolone cream (KENALOG) 0.1 % Apply to FEET  twice a day for up to two weeks, avoid face. 80 g 0  . Vitamin D, Ergocalciferol, (DRISDOL) 50000 units CAPS capsule Take 1 capsule (50,000 Units total) by mouth every 7 (seven) days. 12 capsule 1   No current facility-administered medications for this visit.    No Known Allergies    Review of Systems:  Constitutional:  No  fever, no chills, No recent illness, No unintentional weight changes. +significant fatigue.   HEENT: No  headache, no vision change  Cardiac: No  chest pain, No  pressure, No palpitations, No  Orthopnea  Respiratory:  No  shortness of breath  Gastrointestinal: No  abdominal pain, + occasional nausea  Musculoskeletal: No new myalgia/arthralgia  Neurologic: +weakness, +dizziness, No  slurred speech/focal weakness/facial droop  Psychiatric: No  concerns with depression, No  concerns with anxiety, No sleep problems, No mood problems  Exam:  BP (!) 115/59   Pulse 76   Ht 5\' 8"  (1.727 m)   Wt 202 lb (91.6 kg)   BMI 30.71 kg/m   Constitutional: VS see above. General Appearance: alert, well-developed, well-nourished, NAD  Eyes: Normal lids and conjunctive, non-icteric sclera  Ears, Nose, Mouth, Throat: MMM, Normal external inspection ears/nares/mouth/lips/gums.  Neck: No masses, trachea midline.   Respiratory: Normal respiratory effort. no wheeze, no rhonchi, no rales  Cardiovascular: S1/S2 normal, no murmur, no rub/gallop auscultated. RRR. No lower extremity edema.   Neurological: No cranial nerve deficit on limited exam. Motor and sensation intact and symmetric. Cerebellar reflexes intact. Normal balance/coordination. No tremor.   Skin: warm, dry, intact. No rash/ulcer.   Psychiatric: Normal judgment/insight. Normal mood and affect. Oriented x3.       ASSESSMENT/PLAN:   Extensive discussion with the patient and his daughter regarding medical therapy to treat his coronary lesion. Blood pressure is low on our readings and confirmed by manual cuff, patient's home monitor is reading a little bit higher than ours. He is reporting some dizziness and overall weakness, we'll decrease dose of metoprolol. Patient due to follow-up with cardiology in the next few weeks.  Coronary artery disease involving native coronary artery of native heart without angina pectoris - Cath 07/2016 (+)RCA calcification not amenable to PCI, medical tx w/ BB, ASA, Statin, Imdur  Essential hypertension, benign - Patient has home blood pressure monitor which measures slightly higher than our equipment in the office  Uncontrolled type 2 diabetes mellitus with other circulatory complication, with long-term current use of insulin (HCC)  Chronic kidney disease, stage 3    Patient Instructions  We are going to reduce the dose of the metoprolol, use up the tablets you have, half tablets and due for new prescription I will call in lower dose medication.  To keep your heart functioning as well as you cannula on the following medications: Metoprolol -  reduce his blood pressure and stress on the heart Isosorbide - helps dilated blood vessels to increase blood flow to the heart tissue Atorvastatin - cholesterol medicine that helps prevent arterial plaque from forming and can stabilize or reduce plaque that is already present Aspirin - mild blood thinner to help deliver blood to the heart tissue Nitroglycerin tablets - take as needed for severe chest pain  See below for dietary modifications to promote heart health and help control diabetes.     Visit summary with medication list and pertinent instructions was printed for patient to review. All questions at time of visit were answered - patient instructed to contact office with any additional concerns. ER/RTC precautions  were reviewed with the patient. Follow-up plan: Return in about 2 weeks (around 08/04/2016) for blood pressure recheck and follow up on new medications.

## 2016-08-04 ENCOUNTER — Ambulatory Visit (INDEPENDENT_AMBULATORY_CARE_PROVIDER_SITE_OTHER): Payer: Medicare Other | Admitting: Osteopathic Medicine

## 2016-08-04 ENCOUNTER — Encounter: Payer: Self-pay | Admitting: Osteopathic Medicine

## 2016-08-04 VITALS — BP 118/67 | HR 89 | Ht 68.0 in | Wt 200.0 lb

## 2016-08-04 DIAGNOSIS — I10 Essential (primary) hypertension: Secondary | ICD-10-CM | POA: Diagnosis not present

## 2016-08-04 DIAGNOSIS — E785 Hyperlipidemia, unspecified: Secondary | ICD-10-CM

## 2016-08-04 DIAGNOSIS — N183 Chronic kidney disease, stage 3 unspecified: Secondary | ICD-10-CM

## 2016-08-04 DIAGNOSIS — I249 Acute ischemic heart disease, unspecified: Secondary | ICD-10-CM

## 2016-08-04 DIAGNOSIS — E1122 Type 2 diabetes mellitus with diabetic chronic kidney disease: Secondary | ICD-10-CM

## 2016-08-04 DIAGNOSIS — E1165 Type 2 diabetes mellitus with hyperglycemia: Secondary | ICD-10-CM

## 2016-08-04 MED ORDER — METOPROLOL SUCCINATE ER 25 MG PO TB24: 12.5000 mg | ORAL_TABLET | Freq: Every day | ORAL | 0 refills | Status: DC

## 2016-08-04 MED ORDER — LANTUS SOLOSTAR 100 UNIT/ML ~~LOC~~ SOPN: 45.0000 [IU] | PEN_INJECTOR | Freq: Every day | SUBCUTANEOUS | 0 refills | Status: DC

## 2016-08-04 MED ORDER — GLIPIZIDE 5 MG PO TABS: 5.0000 mg | ORAL_TABLET | Freq: Two times a day (BID) | ORAL | 1 refills | Status: DC

## 2016-08-04 NOTE — Progress Notes (Signed)
HPI: Matthew Coffey is a 82 y.o. male  who presents to [facility] [place] today, 08/04/16,  for chief complaint of:  Chief Complaint  Patient presents with  . Follow-up    Recently seen for hospital follow-up for coronary artery disease, had cardiac cath, blockage not amenable to PCI, following with cardiology and manage medically. At last visit, patient was complaining of some fatigue issues, blood pressure was a bit low so we decreased dose of beta blocker. Patient is overall feeling maybe a little bit better, still having easily fatigued problems. He is for the most part sedentary. He does not know of any cardiac rehabilitation plan in place for him.  Patient needs refill on glipizide, is taking insulin 45 units per day, no hypoglycemia symptoms.  Compliant with medications per verbal report but does not have pill bottles with him. States that he is reliably taking blood pressure medications including half tablet metoprolol, isosorbide, aspirin, atorvastatin.  Patient is accompanied by wife  Past medical, surgical, social and family history reviewed: Past Medical History:  Diagnosis Date  . Diabetes mellitus without complication (HCC)   . Hyperlipidemia   . Hypertension    No past surgical history on file. Social History  Substance Use Topics  . Smoking status: Former Smoker    Quit date: 10/12/1973  . Smokeless tobacco: Never Used  . Alcohol use No   Family History  Problem Relation Age of Onset  . Heart attack Mother      Current medication list and allergy/intolerance information reviewed:   Current Outpatient Prescriptions on File Prior to Visit  Medication Sig Dispense Refill  . AMBULATORY NON FORMULARY MEDICATION Glucometer testing strips (One Touch Ultra): Use to check blood sugar up to three times a day. Dx: Insulin Dependent Type 2 Diabetes, E11.9,Z79.4 50 Units 11  . aspirin 81 MG chewable tablet Chew 81 mg by mouth daily.    Marland Kitchen  atorvastatin (LIPITOR) 80 MG tablet Take 1 tablet (80 mg total) by mouth daily. 90 tablet 3  . glipiZIDE (GLUCOTROL) 5 MG tablet Take 1 tablet (5 mg total) by mouth 2 (two) times daily before a meal. 180 tablet 1  . Insulin Pen Needle 31G X 5 MM MISC Use fresh needle to inject insulin subcutaneously daily. Dx: Insulin Dependent Type 2 Diabetes E11.9, Z79.4 50 each 11  . isosorbide mononitrate (IMDUR) 30 MG 24 hr tablet Take 30 mg by mouth daily.    Marland Kitchen LANTUS SOLOSTAR 100 UNIT/ML Solostar Pen Inject 42 Units into the skin daily. 15 pen 0  . metoprolol succinate (TOPROL-XL) 25 MG 24 hr tablet Take 12.5 mg by mouth daily.    . nitroGLYCERIN (NITROSTAT) 0.4 MG SL tablet Place 0.4 mg under the tongue as needed for chest pain.    Marland Kitchen triamcinolone cream (KENALOG) 0.1 % Apply to FEET  twice a day for up to two weeks, avoid face. 80 g 0  . Vitamin D, Ergocalciferol, (DRISDOL) 50000 units CAPS capsule Take 1 capsule (50,000 Units total) by mouth every 7 (seven) days. 12 capsule 1   No current facility-administered medications on file prior to visit.    No Known Allergies    Review of Systems:  Constitutional: No recent illness  HEENT: No  headache, no vision change  Cardiac: No  chest pain, No  pressure, No palpitations  Respiratory:  No  shortness of breath. No  Cough  Gastrointestinal: No  abdominal pain, no change on bowel habits, no bloody/black stool  Musculoskeletal: No new myalgia/arthralgia  Skin: No  Rash  Neurologic: + generalized weakness, No  Dizziness  Psychiatric: No  concerns with depression, No  concerns with anxiety  Exam:  BP 118/67   Pulse 89   Ht 5\' 8"  (1.727 m)   Wt 200 lb (90.7 kg)   BMI 30.41 kg/m   Constitutional: VS see above. General Appearance: alert, well-developed, well-nourished, NAD  Eyes: Normal lids and conjunctive, non-icteric sclera  Ears, Nose, Mouth, Throat: MMM, Normal external inspection ears/nares/mouth/lips/gums.  Neck: No masses, trachea  midline.   Respiratory: Normal respiratory effort. no wheeze, no rhonchi, no rales  Cardiovascular: S1/S2 normal, no murmur, no rub/gallop auscultated. RRR.   Musculoskeletal: Gait normal. Symmetric and independent movement of all extremities  Neurological: Normal balance/coordination. No tremor.  Skin: warm, dry, intact.   Psychiatric: Normal judgment/insight. Normal mood and affect. Oriented x3.     ASSESSMENT/PLAN: Patient is here has upcoming follow-up with cardiology within the next few months. I advised him to contact cardiologist to see if any cardiac rehabilitation programs are available to him. Advised maintain activity to tolerable level, if developing chest pains with exertion, stop and alert physician. Hemoglobin was on hospital discharge 13.1. Patient has chronically low platelets. Poorly controlled diabetes. Repeat labs next visit/sooner if no improvement or worse. Creatinine stable in hospital discharge.  Essential hypertension, benign - Plan: metoprolol succinate (TOPROL-XL) 25 MG 24 hr tablet  Uncontrolled type 2 diabetes mellitus with chronic kidney disease, without long-term current use of insulin, unspecified CKD stage (HCC) - Plan: glipiZIDE (GLUCOTROL) 5 MG tablet, LANTUS SOLOSTAR 100 UNIT/ML Solostar Pen  Chronic kidney disease, stage 3  Hyperlipidemia, unspecified hyperlipidemia type      Visit summary with medication list and pertinent instructions was printed for patient to review. All questions at time of visit were answered - patient instructed to contact office with any additional concerns. ER/RTC precautions were reviewed with the patient. Follow-up plan: keep currenlty scheduled appointment unless needed

## 2016-10-07 ENCOUNTER — Encounter: Payer: Self-pay | Admitting: Osteopathic Medicine

## 2016-10-07 ENCOUNTER — Ambulatory Visit (INDEPENDENT_AMBULATORY_CARE_PROVIDER_SITE_OTHER): Payer: Medicare Other | Admitting: Osteopathic Medicine

## 2016-10-07 VITALS — BP 142/66 | HR 79 | Ht 68.0 in | Wt 204.0 lb

## 2016-10-07 DIAGNOSIS — E118 Type 2 diabetes mellitus with unspecified complications: Secondary | ICD-10-CM | POA: Diagnosis not present

## 2016-10-07 DIAGNOSIS — I2510 Atherosclerotic heart disease of native coronary artery without angina pectoris: Secondary | ICD-10-CM | POA: Diagnosis not present

## 2016-10-07 DIAGNOSIS — N183 Chronic kidney disease, stage 3 unspecified: Secondary | ICD-10-CM

## 2016-10-07 LAB — POCT GLYCOSYLATED HEMOGLOBIN (HGB A1C): Hemoglobin A1C: 9.4

## 2016-10-07 NOTE — Progress Notes (Signed)
HPI: Matthew Coffey is a 82 y.o. male  who presents to [facility] [place] today, 10/07/16,  for chief complaint of:  Chief Complaint  Patient presents with  . Follow-up    DIABETES    Diabetes: A1c shows some elevation, patient reports minimal compliance with diabetic diet, also he seems to be eating more of his wife's leftovers since her appetite is a bit decreased and he doesn't want to be wasting food. Has gained about 4 pounds since October. Denies hypoglycemic symptoms. He is on insulin but she is inconsistently checking blood sugars, typically he states he doesn't check his sugars.  Coronary artery disease: Follow-up with cardiology had to be rescheduled due to weather. Patient denies chest pain, pressure, shortness of breath. Compliant with medications as below  CKD3: Most recent creatinine stable.   Patient is accompanied by wife  Past medical, surgical, social and family history reviewed: Patient Active Problem List   Diagnosis Date Noted  . ACS (acute coronary syndrome) (HCC) 07/11/2016  . Non-STEMI (non-ST elevated myocardial infarction) (HCC) 07/11/2016  . Thrombocytopenia (HCC) 07/11/2016  . Left knee pain 12/19/2015  . History of nephrectomy 09/16/2015  . Type II diabetes mellitus, uncontrolled (HCC) 09/10/2015  . Hyperlipidemia 09/10/2015  . Essential hypertension, benign 09/10/2015  . Chronic kidney disease, stage 3 09/10/2015  . Arthritis, senescent 09/10/2015   No past surgical history on file. Social History  Substance Use Topics  . Smoking status: Former Smoker    Quit date: 10/12/1973  . Smokeless tobacco: Never Used  . Alcohol use No   Family History  Problem Relation Age of Onset  . Heart attack Mother      Current medication list and allergy/intolerance information reviewed:   Current Outpatient Prescriptions on File Prior to Visit  Medication Sig Dispense Refill  . AMBULATORY NON FORMULARY MEDICATION Glucometer testing  strips (One Touch Ultra): Use to check blood sugar up to three times a day. Dx: Insulin Dependent Type 2 Diabetes, E11.9,Z79.4 50 Units 11  . aspirin 81 MG chewable tablet Chew 81 mg by mouth daily.    Marland Kitchen. atorvastatin (LIPITOR) 80 MG tablet Take 1 tablet (80 mg total) by mouth daily. 90 tablet 3  . glipiZIDE (GLUCOTROL) 5 MG tablet Take 1 tablet (5 mg total) by mouth 2 (two) times daily before a meal. 180 tablet 1  . Insulin Pen Needle 31G X 5 MM MISC Use fresh needle to inject insulin subcutaneously daily. Dx: Insulin Dependent Type 2 Diabetes E11.9, Z79.4 50 each 11  . isosorbide mononitrate (IMDUR) 30 MG 24 hr tablet Take 30 mg by mouth daily.    Marland Kitchen. LANTUS SOLOSTAR 100 UNIT/ML Solostar Pen Inject 45 Units into the skin daily. 15 pen 0  . metoprolol succinate (TOPROL-XL) 25 MG 24 hr tablet Take 0.5 tablets (12.5 mg total) by mouth daily. 45 tablet 0  . nitroGLYCERIN (NITROSTAT) 0.4 MG SL tablet Place 0.4 mg under the tongue as needed for chest pain.    Marland Kitchen. triamcinolone cream (KENALOG) 0.1 % Apply to FEET  twice a day for up to two weeks, avoid face. 80 g 0  . Vitamin D, Ergocalciferol, (DRISDOL) 50000 units CAPS capsule Take 1 capsule (50,000 Units total) by mouth every 7 (seven) days. 12 capsule 1   No current facility-administered medications on file prior to visit.    No Known Allergies    Review of Systems:  Constitutional: No recent illness  HEENT: No  headache, no vision change  Cardiac:  No  chest pain, No  pressure, No palpitations  Respiratory:  No  shortness of breath. No  Cough  Gastrointestinal: No  abdominal pain, no change on bowel habits  Neurologic: No  weakness, No  Dizziness   Exam:  BP (!) 142/66   Pulse 79   Ht 5\' 8"  (1.727 m)   Wt 204 lb (92.5 kg)   BMI 31.02 kg/m   Constitutional: VS see above. General Appearance: alert, well-developed, well-nourished, NAD  Eyes: Normal lids and conjunctive, non-icteric sclera  Ears, Nose, Mouth, Throat: MMM, Normal  external inspection ears/nares/mouth/lips/gums.  Neck: No masses, trachea midline.   Respiratory: Normal respiratory effort. no wheeze, no rhonchi, no rales  Cardiovascular: S1/S2 normal, no murmur, no rub/gallop auscultated. RRR.   Musculoskeletal: Gait normal. Symmetric and independent movement of all extremities  Neurological: Normal balance/coordination. No tremor.  Skin: warm, dry, intact.   Psychiatric: Normal judgment/insight. Normal mood and affect. Oriented x3.    Results for orders placed or performed in visit on 10/07/16 (from the past 72 hour(s))  POCT HgB A1C     Status: None   Collection Time: 10/07/16  2:40 PM  Result Value Ref Range   Hemoglobin A1C 9.4       ASSESSMENT/PLAN:   Think with modest increase in insulin to 50 units daily and stressed compliance with diabetic diet A1c should improve. Hesitate to give the patient to much freedom to increase dose of insulin since he is currently not taking blood sugar measurements appropriately, without to avoid too much self adjustment/hypoglycemia risk.  Chest organs of cardiology follow-up,  Type 2 diabetes mellitus with complication, unspecified long term insulin use status (HCC) - Plan: POCT HgB A1C  Chronic kidney disease, stage 3  Coronary artery disease involving native coronary artery of native heart without angina pectoris    Patient Instructions  Plan:  Diabetes: Your A1c is looking a little bit worse from last time. I suspect this may be due to not following diabetic diet very closely. Please be checking your fasting morning sugars. Our goal is 80-120 morning blood sugar. You are currently taking 45 units of insulin per day, can increase this to 47 Units this week and then 50 Units the week after that as long as blood sugars are not dropping below 80-100 consistently. Please let us if your blood sugars are dropping. Please be sure to bring your blood sugar measurements to your doctor visits.  Otherwise,  continue current medications as you are doing!     Visit summary with medication list and pertinent instructions was printed for patient to review. All questions at time of visit were answered - patient instructed to contact office with any additional concerns. ER/RTC precautions were reviewed with the patient. Follow-up plan: Return in about 3 months (around 01/05/2017) for DIABETES FOLLOWUP.

## 2016-10-07 NOTE — Patient Instructions (Signed)
Plan:  Diabetes: Your A1c is looking a little bit worse from last time. I suspect this may be due to not following diabetic diet very closely. Please be checking your fasting morning sugars. Our goal is 80-120 morning blood sugar. You are currently taking 45 units of insulin per day, can increase this to 47 Units this week and then 50 Units the week after that as long as blood sugars are not dropping below 80-100 consistently. Please let us if your blood sugars are dropping. Please be sure to bring your blood sugar measurements to your doctor visits.  Otherwise, continue current medications as you are doing!

## 2016-11-01 ENCOUNTER — Encounter: Payer: Self-pay | Admitting: Osteopathic Medicine

## 2016-11-01 DIAGNOSIS — I350 Nonrheumatic aortic (valve) stenosis: Secondary | ICD-10-CM | POA: Insufficient documentation

## 2016-11-01 DIAGNOSIS — I2510 Atherosclerotic heart disease of native coronary artery without angina pectoris: Secondary | ICD-10-CM | POA: Insufficient documentation

## 2016-11-01 DIAGNOSIS — I4510 Unspecified right bundle-branch block: Secondary | ICD-10-CM | POA: Insufficient documentation

## 2016-11-02 ENCOUNTER — Other Ambulatory Visit: Payer: Self-pay | Admitting: Osteopathic Medicine

## 2016-11-02 MED ORDER — VITAMIN D (ERGOCALCIFEROL) 1.25 MG (50000 UNIT) PO CAPS: 50000.0000 [IU] | ORAL_CAPSULE | ORAL | 1 refills | Status: DC

## 2016-11-19 ENCOUNTER — Encounter: Payer: Self-pay | Admitting: Osteopathic Medicine

## 2016-11-19 ENCOUNTER — Ambulatory Visit (INDEPENDENT_AMBULATORY_CARE_PROVIDER_SITE_OTHER): Payer: Medicare Other | Admitting: Osteopathic Medicine

## 2016-11-19 VITALS — BP 133/80 | HR 93 | Temp 97.2°F | Wt 198.0 lb

## 2016-11-19 DIAGNOSIS — R69 Illness, unspecified: Secondary | ICD-10-CM | POA: Diagnosis not present

## 2016-11-19 DIAGNOSIS — E1165 Type 2 diabetes mellitus with hyperglycemia: Secondary | ICD-10-CM

## 2016-11-19 DIAGNOSIS — J069 Acute upper respiratory infection, unspecified: Secondary | ICD-10-CM

## 2016-11-19 DIAGNOSIS — E1122 Type 2 diabetes mellitus with diabetic chronic kidney disease: Secondary | ICD-10-CM | POA: Diagnosis not present

## 2016-11-19 DIAGNOSIS — B9789 Other viral agents as the cause of diseases classified elsewhere: Secondary | ICD-10-CM

## 2016-11-19 DIAGNOSIS — I10 Essential (primary) hypertension: Secondary | ICD-10-CM

## 2016-11-19 DIAGNOSIS — J111 Influenza due to unidentified influenza virus with other respiratory manifestations: Secondary | ICD-10-CM

## 2016-11-19 MED ORDER — AZITHROMYCIN 250 MG PO TABS: ORAL_TABLET | ORAL | 0 refills | Status: DC

## 2016-11-19 MED ORDER — GUAIFENESIN-CODEINE 100-10 MG/5ML PO SYRP: 5.0000 mL | ORAL_SOLUTION | Freq: Three times a day (TID) | ORAL | 0 refills | Status: DC | PRN

## 2016-11-19 MED ORDER — LANTUS SOLOSTAR 100 UNIT/ML ~~LOC~~ SOPN: 45.0000 [IU] | PEN_INJECTOR | Freq: Every day | SUBCUTANEOUS | 0 refills | Status: DC

## 2016-11-19 MED ORDER — IPRATROPIUM BROMIDE 0.03 % NA SOLN: 2.0000 | Freq: Three times a day (TID) | NASAL | 0 refills | Status: DC

## 2016-11-19 NOTE — Patient Instructions (Signed)
Note: the following list assumes no pregnancy, normal liver & kidney function and no other drug interactions. Dr. Lyn HollingsheadAlexander has highlighted medications which are safe for you to use, but these may not be appropriate for everyone. Always ask a pharmacist or qualified medical provider if there are any questions!    Aches/Pains, Fever Acetaminophen (Tylenol) 500 mg tablets - take max 2 tablets (1000 mg) every 6 hours (4 times per day)  Ibuprofen (Motrin) 200 mg tablets - take max 4 tablets (800 mg) every 6 hours - caution with high blood pressure  Sinus Congestion Prescription Atrovent Nasal Spray Nasal Saline if desired Oxymetolazone (Afrin, others) sparing use due to rebound congestion Phenylephrine (Sudafed) 10 mg tablets every 4 hours (or the 12-hour formulation) - caution with high blood pressure  Diphenhydramine (Benadryl) 25 mg tablets - take max 1-2 tablets every 4 hours  Cough & Sore Throat Prescription cough pills or syrups Lozenges w/ Benzocaine + Menthol (Cepacol) Honey - as much as you want! Teas which "coat the throat" - look for ingredients Elm Bark, Licorice Root, Marshmallow Root  Other Zinc Lozenges within 24 hours of symptoms onset - mixed evidence this shortens the duration of the common cold Don't waste your money on Vitamin C or Echinacea

## 2016-11-19 NOTE — Progress Notes (Signed)
HPI: Matthew PalmerClaude Gaugh is a 83 y.o. male who presents to [facility] [place] 11/19/16 for chief complaint of:  Chief Complaint  Patient presents with  . Cough    Acute Illness: . Quality: coughing, chest mucusy, sore abdomen/chest d/t coughing . Assoc signs/symptoms: see ROS . Duration: 4 days . Modifying factors: has tried the following OTC/Rx medications: NyQuil, Other OTC cough medicine he's not sure is working   Past medical, social and family history reviewed. Immune compromising conditions or other risk factors: DM2, CAD  Current medications and allergies reviewed.     Review of Systems:  Constitutional: subjective fever/chills  HEENT: No  headache, Yes  sore throat, No  swollen glands  Cardiovascular: No chest pain  Respiratory:Yes  cough, No  shortness of breath  Gastrointestinal: Yes  nausea, No  vomiting,  No  diarrhea  Musculoskeletal:   No  myalgia/arthralgia  Skin/Integument:  No  rash   Detailed Exam:  BP (!) 156/73   Pulse 93   Temp 97.2 F (36.2 C) (Oral)   Wt 198 lb (89.8 kg)   BMI 30.11 kg/m   Constitutional:   VSS, see above.   General Appearance: alert, well-developed, well-nourished, NAD  Eyes:   Normal lids and conjunctive, non-icteric sclera  Ears, Nose, Mouth, Throat:   Normal external inspection ears/nares  Normal mouth/lips/gums, MMM  normal TM  posterior pharynx without erythema, without exudate  nasal mucosa normal  Skin:  Normal inspection, no rash or concerning lesions noted on limited exam  Neck:   No masses, trachea midline. normal lymph nodes  Respiratory:   Normal respiratory effort.   No  wheeze/rhonchi/rales  Cardiovascular:   S1/S2 normal, no murmur/rub/gallop auscultated. RRR.   ASSESSMENT/PLAN: Outside window for initiation of Tamiflu but lack of GI symptoms and serious fever precludes confident diagnosis in influenza. Or likely viral bronchitis. Patient given printed  prescription for azithromycin with instructions to fill if symptoms persist longer than 7 days or so, sooner if he gets worse but if he does get worse may need to consider chest x-ray/follow-up in the office. Also requested 90 day supply of insulin, this was sent. Has some confusion about whether or not he is due for refills on blood pressure medications, asked to discuss with pharmacy.  Viral URI with cough - Plan: guaiFENesin-codeine (ROBITUSSIN AC) 100-10 MG/5ML syrup, ipratropium (ATROVENT) 0.03 % nasal spray, azithromycin (ZITHROMAX) 250 MG tablet  Influenza-like illness - Plan: guaiFENesin-codeine (ROBITUSSIN AC) 100-10 MG/5ML syrup, ipratropium (ATROVENT) 0.03 % nasal spray  Essential hypertension, benign  Uncontrolled type 2 diabetes mellitus with chronic kidney disease, without long-term current use of insulin, unspecified CKD stage (HCC) - Plan: LANTUS SOLOSTAR 100 UNIT/ML Solostar Pen     Visit summary was printed for the patient with medications and pertinent instructions for patient to review. ER/RTC precautions reviewed. All questions answered. Return if symptoms worsen or fail to improve.

## 2016-11-27 ENCOUNTER — Other Ambulatory Visit: Payer: Self-pay

## 2016-11-27 ENCOUNTER — Telehealth: Payer: Self-pay | Admitting: Osteopathic Medicine

## 2016-11-27 DIAGNOSIS — E1122 Type 2 diabetes mellitus with diabetic chronic kidney disease: Secondary | ICD-10-CM

## 2016-11-27 DIAGNOSIS — E1165 Type 2 diabetes mellitus with hyperglycemia: Principal | ICD-10-CM

## 2016-11-27 MED ORDER — LANTUS SOLOSTAR 100 UNIT/ML ~~LOC~~ SOPN: 45.0000 [IU] | PEN_INJECTOR | Freq: Every day | SUBCUTANEOUS | 1 refills | Status: DC

## 2016-11-27 NOTE — Telephone Encounter (Signed)
Patient had some concerns about insulin prescription, looks like this was already corrected. Also had some questions about his wife Corrie DandyMary, please see her chart for full details.

## 2016-11-27 NOTE — Telephone Encounter (Signed)
Needs to converse about medication

## 2016-12-01 ENCOUNTER — Other Ambulatory Visit: Payer: Self-pay | Admitting: Osteopathic Medicine

## 2016-12-01 DIAGNOSIS — I10 Essential (primary) hypertension: Secondary | ICD-10-CM

## 2017-01-05 ENCOUNTER — Ambulatory Visit (INDEPENDENT_AMBULATORY_CARE_PROVIDER_SITE_OTHER): Payer: Medicare Other | Admitting: Osteopathic Medicine

## 2017-01-05 VITALS — BP 123/73 | HR 85 | Wt 203.0 lb

## 2017-01-05 DIAGNOSIS — E1159 Type 2 diabetes mellitus with other circulatory complications: Secondary | ICD-10-CM

## 2017-01-05 DIAGNOSIS — I25118 Atherosclerotic heart disease of native coronary artery with other forms of angina pectoris: Secondary | ICD-10-CM | POA: Diagnosis not present

## 2017-01-05 DIAGNOSIS — Z794 Long term (current) use of insulin: Secondary | ICD-10-CM

## 2017-01-05 DIAGNOSIS — I10 Essential (primary) hypertension: Secondary | ICD-10-CM | POA: Diagnosis not present

## 2017-01-05 DIAGNOSIS — E785 Hyperlipidemia, unspecified: Secondary | ICD-10-CM | POA: Diagnosis not present

## 2017-01-05 LAB — POCT GLYCOSYLATED HEMOGLOBIN (HGB A1C): Hemoglobin A1C: 9.1

## 2017-01-05 NOTE — Progress Notes (Signed)
HPI: Matthew Coffey is a 83 y.o. male  who [facility] [place] today, 01/05/17,  for chief complaint of:  Chief Complaint  Patient presents with  . Follow-up    DIABETES     Diabetes: A1c shows some elevation, patient reports minimal compliance with diabetic diet, also he seems to be eating more of his wife's leftovers since her appetite is a bit decreased and he doesn't want to be wasting food. Has gained about 4 pounds since October. Denies hypoglycemic symptoms. He is on insulin but she is inconsistently checking blood sugars, typically he states he doesn't check his sugars. Last visit we increased Insulin up to goal of 50 units daily and emphasized regularly monitoring home Glc and sticking to low-carb dietary changes. Minimal change in A1C since last visit. Patient states he has not titrated up on his insulin and he is still not checking home blood sugar. He states he is more worried about his wife and himself.  HTN & Coronary artery disease: Following with cardiology. Patient denies chest pain, pressure, shortness of breath. Compliant with medications as below  CKD3: Most recent creatinine stable.   HLD: not watching diet   Patient is accompanied by wife.   Past medical history, surgical history, social history and family history reviewed.  Patient Active Problem List   Diagnosis Date Noted  . Coronary artery disease 11/01/2016  . Right bundle branch block 11/01/2016  . Aortic stenosis 11/01/2016  . ACS (acute coronary syndrome) (HCC) 07/11/2016  . Non-STEMI (non-ST elevated myocardial infarction) (HCC) 07/11/2016  . Thrombocytopenia (HCC) 07/11/2016  . Left knee pain 12/19/2015  . History of nephrectomy 09/16/2015  . Type II diabetes mellitus, uncontrolled (HCC) 09/10/2015  . Hyperlipidemia 09/10/2015  . Essential hypertension, benign 09/10/2015  . Chronic kidney disease, stage 3 09/10/2015  . Arthritis, senescent 09/10/2015     Current medication list and allergy/intolerance information reviewed.   Current Outpatient Prescriptions on File Prior to Visit  Medication Sig Dispense Refill  . AMBULATORY NON FORMULARY MEDICATION Glucometer testing strips (One Touch Ultra): Use to check blood sugar up to three times a day. Dx: Insulin Dependent Type 2 Diabetes, E11.9,Z79.4 50 Units 11  . aspirin 81 MG chewable tablet Chew 81 mg by mouth daily.    Marland Kitchen. atorvastatin (LIPITOR) 80 MG tablet Take 1 tablet (80 mg total) by mouth daily. 90 tablet 3  . azithromycin (ZITHROMAX) 250 MG tablet 2 tabs po x1 on Day 1, then 1 tab po daily on Days 2 - 5. Fill Rx if fever >100.1, if worsening cough despite treatment, if symptoms persist >7-10 days 6 tablet 0  . glipiZIDE (GLUCOTROL) 5 MG tablet Take 1 tablet (5 mg total) by mouth 2 (two) times daily before a meal. 180 tablet 1  . Insulin Pen Needle 31G X 5 MM MISC Use fresh needle to inject insulin subcutaneously daily. Dx: Insulin Dependent Type 2 Diabetes E11.9, Z79.4 50 each 11  . ipratropium (ATROVENT) 0.03 % nasal spray Place 2 sprays into both nostrils 3 (three) times daily. 30 mL 0  . isosorbide mononitrate (IMDUR) 30 MG 24 hr tablet Take 30 mg by mouth daily.    Marland Kitchen. LANTUS SOLOSTAR 100 UNIT/ML Solostar Pen Inject 45 Units into the skin daily. 45 pen 1  . metoprolol succinate (TOPROL-XL) 25 MG 24 hr tablet TAKE ONE-HALF TABLET BY MOUTH ONCE DAILY 45 tablet 0  . nitroGLYCERIN (NITROSTAT) 0.4 MG SL tablet Place 0.4 mg under the tongue as  needed for chest pain.    Marland Kitchen triamcinolone cream (KENALOG) 0.1 % Apply to FEET  twice a day for up to two weeks, avoid face. 80 g 0  . Vitamin D, Ergocalciferol, (DRISDOL) 50000 units CAPS capsule Take 1 capsule (50,000 Units total) by mouth every 7 (seven) days. 12 capsule 1   No current facility-administered medications on file prior to visit.    No Known Allergies    Review of Systems:  Constitutional: No recent illness  HEENT: No  headache, no  vision change  Cardiac: No  chest pain, No  pressure, No palpitations  Respiratory:  No  shortness of breath. No  Cough  Gastrointestinal: No  abdominal pain, no change on bowel habits  Musculoskeletal: No new myalgia/arthralgia  Skin: No  Rash  Hem/Onc: No  easy bruising/bleeding, No  abnormal lumps/bumps  Neurologic: No  weakness, No  Dizziness  Psychiatric: No  concerns with depression, No  concerns with anxiety  Exam:  BP 123/73   Pulse 85   Wt 203 lb (92.1 kg)   BMI 30.87 kg/m   Constitutional: VS see above. General Appearance: alert, well-developed, well-nourished, NAD  Eyes: Normal lids and conjunctive, non-icteric sclera  Ears, Nose, Mouth, Throat: MMM, Normal external inspection ears/nares/mouth/lips/gums.  Neck: No masses, trachea midline.   Respiratory: Normal respiratory effort. no wheeze, no rhonchi, no rales  Cardiovascular: S1/S2 normal, no murmur, no rub/gallop auscultated. RRR.   Musculoskeletal: Gait normal. Symmetric and independent movement of all extremities  Neurological: Normal balance/coordination. No tremor.  Skin: warm, dry, intact.   Psychiatric: Normal judgment/insight. Normal mood and affect. Oriented x3.    Recent Results (from the past 2160 hour(s))  POCT HgB A1C     Status: None   Collection Time: 10/07/16  2:40 PM  Result Value Ref Range   Hemoglobin A1C 9.4    Results for orders placed or performed in visit on 01/05/17 (from the past 24 hour(s))  POCT HgB A1C     Status: None   Collection Time: 01/05/17  2:15 PM  Result Value Ref Range   Hemoglobin A1C 9.1      ASSESSMENT/PLAN: Emphasized diabetes control to reduce risk of having another heart attack, to stay healthy enough to take care of his wife.  Type 2 diabetes mellitus with other circulatory complication, with long-term current use of insulin (HCC) - Plan: POCT HgB A1C  Coronary artery disease of native artery of native heart with stable angina pectoris  (HCC)  Hyperlipidemia, unspecified hyperlipidemia type  Essential hypertension, benign    Patient Instructions  Plan:  This week: Insulin to 45 units  Next week: Insulin to 50 units and keep at that dose  Recheck A1C 3 months     Follow-up plan: Return in about 3 months (around 04/07/2017) for recheck diabetes .  Visit summary with medication list and pertinent instructions was printed for patient to review, alert [place] if any changes needed. All questions at time of visit were answered - patient instructed to contact office with any additional concerns. ER/RTC precautions were reviewed with the patient and understanding verbalized.

## 2017-01-05 NOTE — Patient Instructions (Signed)
Plan:  This week: Insulin to 45 units  Next week: Insulin to 50 units and keep at that dose  Recheck A1C 3 months

## 2017-02-02 ENCOUNTER — Other Ambulatory Visit: Payer: Self-pay | Admitting: Osteopathic Medicine

## 2017-02-02 DIAGNOSIS — E1165 Type 2 diabetes mellitus with hyperglycemia: Principal | ICD-10-CM

## 2017-02-02 DIAGNOSIS — E1122 Type 2 diabetes mellitus with diabetic chronic kidney disease: Secondary | ICD-10-CM

## 2017-02-19 ENCOUNTER — Other Ambulatory Visit: Payer: Self-pay | Admitting: Osteopathic Medicine

## 2017-02-19 DIAGNOSIS — I10 Essential (primary) hypertension: Secondary | ICD-10-CM

## 2017-05-11 ENCOUNTER — Other Ambulatory Visit: Payer: Self-pay | Admitting: Osteopathic Medicine

## 2017-05-11 DIAGNOSIS — E1165 Type 2 diabetes mellitus with hyperglycemia: Principal | ICD-10-CM

## 2017-05-11 DIAGNOSIS — E1122 Type 2 diabetes mellitus with diabetic chronic kidney disease: Secondary | ICD-10-CM

## 2017-05-14 ENCOUNTER — Other Ambulatory Visit: Payer: Self-pay

## 2017-05-14 DIAGNOSIS — E1165 Type 2 diabetes mellitus with hyperglycemia: Principal | ICD-10-CM

## 2017-05-14 DIAGNOSIS — E1122 Type 2 diabetes mellitus with diabetic chronic kidney disease: Secondary | ICD-10-CM

## 2017-05-15 ENCOUNTER — Other Ambulatory Visit: Payer: Self-pay | Admitting: Osteopathic Medicine

## 2017-05-15 DIAGNOSIS — E1122 Type 2 diabetes mellitus with diabetic chronic kidney disease: Secondary | ICD-10-CM

## 2017-05-15 DIAGNOSIS — E1165 Type 2 diabetes mellitus with hyperglycemia: Principal | ICD-10-CM

## 2017-05-24 ENCOUNTER — Ambulatory Visit: Payer: Medicare Other | Admitting: Osteopathic Medicine

## 2017-05-28 ENCOUNTER — Other Ambulatory Visit: Payer: Self-pay | Admitting: Osteopathic Medicine

## 2017-05-28 DIAGNOSIS — I10 Essential (primary) hypertension: Secondary | ICD-10-CM

## 2017-06-01 ENCOUNTER — Other Ambulatory Visit: Payer: Self-pay | Admitting: Osteopathic Medicine

## 2017-06-02 ENCOUNTER — Encounter: Payer: Self-pay | Admitting: Osteopathic Medicine

## 2017-06-02 ENCOUNTER — Ambulatory Visit (INDEPENDENT_AMBULATORY_CARE_PROVIDER_SITE_OTHER): Payer: Medicare Other | Admitting: Osteopathic Medicine

## 2017-06-02 VITALS — BP 127/79 | HR 87 | Ht 68.0 in | Wt 203.0 lb

## 2017-06-02 DIAGNOSIS — M25612 Stiffness of left shoulder, not elsewhere classified: Secondary | ICD-10-CM

## 2017-06-02 DIAGNOSIS — Z794 Long term (current) use of insulin: Secondary | ICD-10-CM | POA: Diagnosis not present

## 2017-06-02 DIAGNOSIS — E1159 Type 2 diabetes mellitus with other circulatory complications: Secondary | ICD-10-CM

## 2017-06-02 DIAGNOSIS — I10 Essential (primary) hypertension: Secondary | ICD-10-CM | POA: Diagnosis not present

## 2017-06-02 DIAGNOSIS — E1165 Type 2 diabetes mellitus with hyperglycemia: Secondary | ICD-10-CM

## 2017-06-02 DIAGNOSIS — E1122 Type 2 diabetes mellitus with diabetic chronic kidney disease: Secondary | ICD-10-CM

## 2017-06-02 DIAGNOSIS — IMO0002 Reserved for concepts with insufficient information to code with codable children: Secondary | ICD-10-CM

## 2017-06-02 DIAGNOSIS — Z23 Encounter for immunization: Secondary | ICD-10-CM

## 2017-06-02 DIAGNOSIS — E559 Vitamin D deficiency, unspecified: Secondary | ICD-10-CM

## 2017-06-02 LAB — POCT UA - MICROALBUMIN
Creatinine, POC: 300 mg/dL
MICROALBUMIN (UR) POC: 80 mg/L

## 2017-06-02 LAB — POCT GLYCOSYLATED HEMOGLOBIN (HGB A1C): HEMOGLOBIN A1C: 8.9

## 2017-06-02 MED ORDER — ATORVASTATIN CALCIUM 80 MG PO TABS: 80.0000 mg | ORAL_TABLET | Freq: Every day | ORAL | 3 refills | Status: DC

## 2017-06-02 MED ORDER — INSULIN PEN NEEDLE 31G X 5 MM MISC
99 refills | Status: AC
Start: 2017-06-02 — End: ?

## 2017-06-02 MED ORDER — VITAMIN D (ERGOCALCIFEROL) 1.25 MG (50000 UNIT) PO CAPS: ORAL_CAPSULE | ORAL | 3 refills | Status: AC

## 2017-06-02 MED ORDER — METOPROLOL SUCCINATE ER 25 MG PO TB24: 12.5000 mg | ORAL_TABLET | Freq: Every day | ORAL | 3 refills | Status: DC

## 2017-06-02 MED ORDER — LANTUS SOLOSTAR 100 UNIT/ML ~~LOC~~ SOPN: 50.0000 [IU] | PEN_INJECTOR | Freq: Every day | SUBCUTANEOUS | 3 refills | Status: DC

## 2017-06-02 MED ORDER — ASPIRIN 81 MG PO CHEW: 81.0000 mg | CHEWABLE_TABLET | Freq: Every day | ORAL | 3 refills | Status: DC

## 2017-06-02 MED ORDER — ISOSORBIDE MONONITRATE ER 30 MG PO TB24: 30.0000 mg | ORAL_TABLET | Freq: Every day | ORAL | 3 refills | Status: DC

## 2017-06-02 NOTE — Patient Instructions (Signed)
STOP Glipizide INCREASE Insulin to 50 Units per day

## 2017-06-02 NOTE — Progress Notes (Signed)
HPI: Matthew Coffey is a 83 y.o. male  who presents to [facility] today, 06/02/17,  for chief complaint of:  Chief Complaint  Patient presents with  . Follow-up    diabetes    Diabetes: A1c today shows some improvement from when last checked. He has not increased the dose of insulin as instructed. He is still taking glipizide. No hypoglycemia.mentally compliant with diabetic diet. Marland Kitchen 01/05/17: 9.1% . 06/02/17: 8.9%  Hypertension:blood pressure well controlled on current medications, no home blood pressures report. No chest pain, pressure, shortness of breath.  Shoulder: complaints of left shoulder lotion without pain. No recent injury.   Past medical history, surgical history, social history and family history reviewed.  Patient Active Problem List   Diagnosis Date Noted  . Coronary artery disease 11/01/2016  . Right bundle branch block 11/01/2016  . Aortic stenosis 11/01/2016  . ACS (acute coronary syndrome) (HCC) 07/11/2016  . Non-STEMI (non-ST elevated myocardial infarction) (HCC) 07/11/2016  . Thrombocytopenia (HCC) 07/11/2016  . Left knee pain 12/19/2015  . History of nephrectomy 09/16/2015  . Type II diabetes mellitus, uncontrolled (HCC) 09/10/2015  . Hyperlipidemia 09/10/2015  . Essential hypertension, benign 09/10/2015  . Chronic kidney disease, stage 3 09/10/2015  . Arthritis, senescent 09/10/2015    Current medication list and allergy/intolerance information reviewed.   Current Outpatient Prescriptions on File Prior to Visit  Medication Sig Dispense Refill  . AMBULATORY NON FORMULARY MEDICATION Glucometer testing strips (One Touch Ultra): Use to check blood sugar up to three times a day. Dx: Insulin Dependent Type 2 Diabetes, E11.9,Z79.4 50 Units 11  . aspirin 81 MG chewable tablet Chew 81 mg by mouth daily.    Marland Kitchen atorvastatin (LIPITOR) 80 MG tablet Take 1 tablet (80 mg total) by mouth daily. 90 tablet 3  . glipiZIDE (GLUCOTROL) 5  MG tablet TAKE ONE TABLET BY MOUTH TWICE DAILY BEFORE MEAL(S) 180 tablet 1  . Insulin Pen Needle 31G X 5 MM MISC Use fresh needle to inject insulin subcutaneously daily. Dx: Insulin Dependent Type 2 Diabetes E11.9, Z79.4 50 each 11  . isosorbide mononitrate (IMDUR) 30 MG 24 hr tablet Take 30 mg by mouth daily.    Marland Kitchen LANTUS SOLOSTAR 100 UNIT/ML Solostar Pen Inject 45 units subcutaneously once daily Needs appt 15 mL 0  . metoprolol succinate (TOPROL-XL) 25 MG 24 hr tablet Take 0.5 tablets (12.5 mg total) by mouth daily. APPOINTMENT NEEDED FOR FURTHER REFILLS 45 tablet 0  . nitroGLYCERIN (NITROSTAT) 0.4 MG SL tablet Place 0.4 mg under the tongue as needed for chest pain.    . Vitamin D, Ergocalciferol, (DRISDOL) 50000 units CAPS capsule TAKE ONE CAPSULE BY MOUTH ONCE EVERY 7 DAYS 12 capsule 1   No current facility-administered medications on file prior to visit.    No Known Allergies    Review of Systems:  Constitutional: No recent illness  HEENT: No  headache, no vision change  Cardiac: No  chest pain, No  pressure, No palpitations  Respiratory:  No  shortness of breath. No  Cough  Gastrointestinal: No  abdominal pain, no change on bowel habits  Musculoskeletal: +new myalgia/arthralgia  Skin: No  Rash  Hem/Onc: No  easy bruising/bleeding, No  abnormal lumps/bumps  Neurologic: No  weakness, No  Dizziness  Psychiatric: No  concerns with depression, No  concerns with anxiety  Exam:  BP 127/79   Pulse 87   Ht 5\' 8"  (1.727 m)   Wt 203 lb (92.1 kg)   BMI  30.87 kg/m   Constitutional: VS see above. General Appearance: alert, well-developed, well-nourished, NAD  Eyes: Normal lids and conjunctive, non-icteric sclera  Ears, Nose, Mouth, Throat: MMM, Normal external inspection ears/nares/mouth/lips/gums.  Neck: No masses, trachea midline.   Respiratory: Normal respiratory effort. no wheeze, no rhonchi, no rales  Cardiovascular: S1/S2 normal, no murmur, no rub/gallop  auscultated. RRR.   Musculoskeletal: Gait normal. Symmetric and independent movement of all extremities  Neurological: Normal balance/coordination. No tremor.  Skin: warm, dry, intact.   Psychiatric: Normal judgment/insight. Normal mood and affect. Oriented x3.    Recent Results (from the past 2160 hour(s))  POCT HgB A1C     Status: None   Collection Time: 06/02/17  2:09 PM  Result Value Ref Range   Hemoglobin A1C 8.9   POCT UA - Microalbumin     Status: Abnormal   Collection Time: 06/02/17  2:09 PM  Result Value Ref Range   Microalbumin Ur, POC 80 mg/L   Creatinine, POC 300 mg/dL   Albumin/Creatinine Ratio, Urine, POC 30-300       ASSESSMENT/PLAN:   Patient Instructions  STOP Glipizide INCREASE Insulin to 50 Units per day      Uncontrolled type 2 diabetes mellitus with other circulatory complication, with long-term current use of insulin (HCC) - Plan: POCT HgB A1C, POCT UA - Microalbumin, Insulin Pen Needle 31G X 5 MM MISC, LANTUS SOLOSTAR 100 UNIT/ML Solostar Pen, atorvastatin (LIPITOR) 80 MG tablet, aspirin 81 MG chewable tablet  Uncontrolled type 2 diabetes mellitus with chronic kidney disease, without long-term current use of insulin, unspecified CKD stage (HCC)  Essential hypertension, benign - Plan: metoprolol succinate (TOPROL-XL) 25 MG 24 hr tablet, isosorbide mononitrate (IMDUR) 30 MG 24 hr tablet  Need for Tdap vaccination - Plan: Tdap vaccine greater than or equal to 7yo IM  Need for influenza vaccination - Plan: Flu vaccine HIGH DOSE PF  Need for vaccination with 13-polyvalent pneumococcal conjugate vaccine - Plan: Pneumococcal conjugate vaccine 13-valent  Vitamin D deficiency - Plan: Vitamin D, Ergocalciferol, (DRISDOL) 50000 units CAPS capsule  Decreased range of motion of left shoulder - Patient is not particularly bothered by this, advised sports medicine follow-up if persists.      Follow-up plan: Return in about 3 months (around 09/02/2017) for  recheck diabetes, sooner if needed .  Visit summary with medication list and pertinent instructions was printed for patient to review, alert [place] if any changes needed. All questions at time of visit were answered - patient instructed to contact office with any additional concerns. ER/RTC precautions were reviewed with the patient and understanding verbalized.   Note: Total time spent 25 minutes, greater than 50% of the visit was spent face-to-face counseling and coordinating care for the following: The primary encounter diagnosis was Uncontrolled type 2 diabetes mellitus with other circulatory complication, with long-term current use of insulin (HCC). Diagnoses of Uncontrolled type 2 diabetes mellitus with chronic kidney disease, without long-term current use of insulin, unspecified CKD stage (HCC), Essential hypertension, benign, Need for Tdap vaccination, Need for influenza vaccination, Need for vaccination with 13-polyvalent pneumococcal conjugate vaccine, Vitamin D deficiency, and Decreased range of motion of left shoulder were also pertinent to this visit.Marland Kitchen

## 2017-07-19 ENCOUNTER — Telehealth: Payer: Self-pay | Admitting: Osteopathic Medicine

## 2017-07-19 DIAGNOSIS — I10 Essential (primary) hypertension: Secondary | ICD-10-CM

## 2017-07-19 MED ORDER — METOPROLOL SUCCINATE ER 25 MG PO TB24: 12.5000 mg | ORAL_TABLET | Freq: Every day | ORAL | 3 refills | Status: DC

## 2017-07-19 NOTE — Telephone Encounter (Signed)
Patient is requesting a refill on his metoprolol succinate. He reported that the prescription was written to take half a pill, but his doctor told him to take a whole pill. He will be running out of meds soon and the pharmacy told him to request another prescription from his doctor. Please advise. If a new prescription can be written, he needs one written for a 90 day supply to save money. Thanks!

## 2017-07-20 NOTE — Telephone Encounter (Signed)
Rx was refilled yesterday, Pt advised.

## 2017-07-23 ENCOUNTER — Telehealth: Payer: Self-pay

## 2017-07-23 NOTE — Telephone Encounter (Signed)
Error. Emerly Prak,CMA  

## 2017-07-26 ENCOUNTER — Other Ambulatory Visit: Payer: Self-pay

## 2017-07-26 DIAGNOSIS — I10 Essential (primary) hypertension: Secondary | ICD-10-CM

## 2017-07-26 MED ORDER — METOPROLOL SUCCINATE ER 25 MG PO TB24: 25.0000 mg | ORAL_TABLET | Freq: Every day | ORAL | 3 refills | Status: DC

## 2017-07-26 NOTE — Telephone Encounter (Signed)
Patient request a new Rx for Metoprolol 25 mg. # 90 3 refills sent to [facility]. Rhonda Cunningham,CMA

## 2017-07-30 ENCOUNTER — Encounter: Payer: Self-pay | Admitting: Osteopathic Medicine

## 2017-09-06 ENCOUNTER — Ambulatory Visit: Payer: Medicare Other | Admitting: Osteopathic Medicine

## 2017-09-07 ENCOUNTER — Encounter: Payer: Self-pay | Admitting: Osteopathic Medicine

## 2017-09-07 ENCOUNTER — Ambulatory Visit (INDEPENDENT_AMBULATORY_CARE_PROVIDER_SITE_OTHER): Payer: Medicare Other | Admitting: Osteopathic Medicine

## 2017-09-07 VITALS — BP 142/76 | HR 68 | Temp 97.8°F | Resp 16 | Wt 196.6 lb

## 2017-09-07 DIAGNOSIS — I10 Essential (primary) hypertension: Secondary | ICD-10-CM

## 2017-09-07 DIAGNOSIS — E1165 Type 2 diabetes mellitus with hyperglycemia: Secondary | ICD-10-CM

## 2017-09-07 DIAGNOSIS — Z794 Long term (current) use of insulin: Secondary | ICD-10-CM

## 2017-09-07 LAB — POCT GLYCOSYLATED HEMOGLOBIN (HGB A1C): HEMOGLOBIN A1C: 9.7

## 2017-09-07 MED ORDER — LANTUS SOLOSTAR 100 UNIT/ML ~~LOC~~ SOPN: 55.0000 [IU] | PEN_INJECTOR | Freq: Every day | SUBCUTANEOUS | 3 refills | Status: DC

## 2017-09-07 NOTE — Patient Instructions (Addendum)
[facility] one day 75, next day 50, so on and so forth Will be due to recheck labs 09/27/17  For you: We need to get your sugars under control!  Let's increase the insulin See below for diet stuff to remember    Diabetes Mellitus and Food It is important for you to manage your blood sugar (glucose) level. Your blood glucose level can be greatly affected by what you eat. Eating healthier foods in the appropriate amounts throughout the day at about the same time each day will help you control your blood glucose level. It can also help slow or prevent worsening of your diabetes mellitus. Healthy eating may even help you improve the level of your blood pressure and reach or maintain a healthy weight. General recommendations for healthful eating and cooking habits include:  Eating meals and snacks regularly. Avoid going long periods of time without eating to lose weight.  Eating a diet that consists mainly of plant-based foods, such as fruits, vegetables, nuts, legumes, and whole grains.  Using low-heat cooking methods, such as baking, instead of high-heat cooking methods, such as deep frying.  Work with your dietitian to make sure you understand how to use the Nutrition Facts information on food labels. How can food affect me? Carbohydrates Carbohydrates affect your blood glucose level more than any other type of food. Your dietitian will help you determine how many carbohydrates to eat at each meal and teach you how to count carbohydrates. Counting carbohydrates is important to keep your blood glucose at a healthy level, especially if you are using insulin or taking certain medicines for diabetes mellitus. Alcohol Alcohol can cause sudden decreases in blood glucose (hypoglycemia), especially if you use insulin or take certain medicines for diabetes mellitus. Hypoglycemia can be a life-threatening condition. Symptoms of hypoglycemia (sleepiness, dizziness, and disorientation) are similar to  symptoms of having too much alcohol. If your health care provider has given you approval to drink alcohol, do so in moderation and use the following guidelines:  Women should not have more than one drink per day, and men should not have more than two drinks per day. One drink is equal to: ? 12 oz of beer. ? 5 oz of wine. ? 1 oz of hard liquor.  Do not drink on an empty stomach.  Keep yourself hydrated. Have water, diet soda, or unsweetened iced tea.  Regular soda, juice, and other mixers might contain a lot of carbohydrates and should be counted.  What foods are not recommended? As you make food choices, it is important to remember that all foods are not the same. Some foods have fewer nutrients per serving than other foods, even though they might have the same number of calories or carbohydrates. It is difficult to get your body what it needs when you eat foods with fewer nutrients. Examples of foods that you should avoid that are high in calories and carbohydrates but low in nutrients include:  Trans fats (most processed foods list trans fats on the Nutrition Facts label).  Regular soda.  Juice.  Candy.  Sweets, such as cake, pie, doughnuts, and cookies.  Fried foods.  What foods can I eat? Eat nutrient-rich foods, which will nourish your body and keep you healthy. The food you should eat also will depend on several factors, including:  The calories you need.  The medicines you take.  Your weight.  Your blood glucose level.  Your blood pressure level.  Your cholesterol level.  You should eat  a variety of foods, including:  Protein. ? Lean cuts of meat. ? Proteins low in saturated fats, such as fish, egg whites, and beans. Avoid processed meats.  Fruits and vegetables. ? Fruits and vegetables that may help control blood glucose levels, such as apples, mangoes, and yams.  Dairy products. ? Choose fat-free or low-fat dairy products, such as milk, yogurt, and  cheese.  Grains, bread, pasta, and rice. ? Choose whole grain products, such as multigrain bread, whole oats, and brown rice. These foods may help control blood pressure.  Fats. ? Foods containing healthful fats, such as nuts, avocado, olive oil, canola oil, and fish.  Does everyone with diabetes mellitus have the same meal plan? Because every person with diabetes mellitus is different, there is not one meal plan that works for everyone. It is very important that you meet with a dietitian who will help you create a meal plan that is just right for you. This information is not intended to replace advice given to you by your health care provider. Make sure you discuss any questions you have with your health care provider. Document Released: 06/25/2005 Document Revised: 03/05/2016 Document Reviewed: 08/25/2013 Elsevier Interactive Patient Education  2017 [employer]

## 2017-09-07 NOTE — Progress Notes (Signed)
HPI: Matthew Coffey is a 83 y.o. male  who [facility], 09/07/17,  for chief complaint of:  Chief Complaint  Patient presents with  . Follow-up    3 mth Diabetes check    Diabetes: A1c today shows some worsening from when last checked. He is on 50 units of long-acting insulin daily. He is still taking glipizide. No hypoglycemia. Not at all compliant with diabetic diet. He has alive concerns about caring for his wife, he does not take very good care of his own sugars and he admits this. He is however very reluctant to change medications at this point. Marland Kitchen. 01/05/17: 9.1% . 06/02/17: 8.9%  09/07/17: 9.7%  Hypertension:blood pressure well controlled on current medications, no home blood pressures report. No chest pain, pressure, shortness of breath.  Shoulder: complaints of left shoulder lotion without pain. No recent injury.   Past medical history, surgical history, social history and family history reviewed.  Patient Active Problem List   Diagnosis Date Noted  . Coronary artery disease 11/01/2016  . Right bundle branch block 11/01/2016  . Aortic stenosis 11/01/2016  . ACS (acute coronary syndrome) (HCC) 07/11/2016  . Non-STEMI (non-ST elevated myocardial infarction) (HCC) 07/11/2016  . Thrombocytopenia (HCC) 07/11/2016  . Left knee pain 12/19/2015  . History of nephrectomy 09/16/2015  . Type II diabetes mellitus, uncontrolled (HCC) 09/10/2015  . Hyperlipidemia 09/10/2015  . Essential hypertension, benign 09/10/2015  . Chronic kidney disease, stage 3 (HCC) 09/10/2015  . Arthritis, senescent 09/10/2015    Current medication list and allergy/intolerance information reviewed.   Current Outpatient Medications on File Prior to Visit  Medication Sig Dispense Refill  . AMBULATORY NON FORMULARY MEDICATION Glucometer testing strips (One Touch Ultra): Use to check blood sugar up to three times a day. Dx: Insulin Dependent Type 2 Diabetes,  E11.9,Z79.4 50 Units 11  . aspirin 81 MG chewable tablet Chew 1 tablet (81 mg total) by mouth daily. 90 tablet 3  . atorvastatin (LIPITOR) 80 MG tablet Take 1 tablet (80 mg total) by mouth daily. 90 tablet 3  . Insulin Pen Needle 31G X 5 MM MISC Use fresh needle to inject insulin subcutaneously daily. Dx: Insulin Dependent Type 2 Diabetes E11.9, Z79.4 100 each 99  . isosorbide mononitrate (IMDUR) 30 MG 24 hr tablet Take 1 tablet (30 mg total) by mouth daily. (Patient taking differently: Take 60 mg by mouth daily. ) 90 tablet 3  . LANTUS SOLOSTAR 100 UNIT/ML Solostar Pen Inject 50 Units into the skin daily. 45 mL 3  . metoprolol succinate (TOPROL-XL) 25 MG 24 hr tablet Take 1 tablet (25 mg total) by mouth daily. 90 tablet 3  . Vitamin D, Ergocalciferol, (DRISDOL) 50000 units CAPS capsule TAKE ONE CAPSULE BY MOUTH ONCE EVERY 7 DAYS 12 capsule 3  . nitroGLYCERIN (NITROSTAT) 0.4 MG SL tablet Place 0.4 mg under the tongue as needed for chest pain.     No current facility-administered medications on file prior to visit.    No Known Allergies    Review of Systems:  Constitutional: No recent illness  HEENT: No  headache, no vision change  Cardiac: No  chest pain, No  pressure, No palpitations  Respiratory:  No  shortness of breath. No  Cough  Gastrointestinal: No  abdominal pain, no change on bowel habits  Musculoskeletal: +new myalgia/arthralgia  Skin: No  Rash  Hem/Onc: No  easy bruising/bleeding, No  abnormal lumps/bumps  Neurologic: No  weakness, No  Dizziness  Psychiatric: No  concerns with depression, No  concerns with anxiety  Exam:  BP (!) 142/76 (BP Location: Left Arm, Patient Position: Sitting, Cuff Size: Large)   Pulse 68   Temp 97.8 F (36.6 C) (Oral)   Resp 16   Wt 196 lb 9.6 oz (89.2 kg)   BMI 29.89 kg/m   Constitutional: VS see above. General Appearance: alert, well-developed, well-nourished, NAD  Eyes: Normal lids and conjunctive, non-icteric sclera  Ears,  Nose, Mouth, Throat: MMM, Normal external inspection ears/nares/mouth/lips/gums.  Neck: No masses, trachea midline.   Respiratory: Normal respiratory effort. no wheeze, no rhonchi, no rales  Cardiovascular: S1/S2 normal, no murmur, no rub/gallop auscultated. RRR.   Musculoskeletal: Gait normal. Symmetric and independent movement of all extremities  Neurological: Normal balance/coordination. No tremor.  Skin: warm, dry, intact.   Psychiatric: Normal judgment/insight. Normal mood and affect. Oriented x3.    Recent Results (from the past 2160 hour(s))  POCT glycosylated hemoglobin (Hb A1C)     Status: Abnormal   Collection Time: 09/07/17  2:14 PM  Result Value Ref Range   Hemoglobin A1C 9.7       ASSESSMENT/PLAN: Patient is advised that if he doesn't get right about diet/exercise and get the sugars under control, we are going to have to likely start mealtime insulin. For now, can increase Lantus dose but we are getting close to the threshold for absorption per injection. May consider toujeo use.  Uncontrolled type 2 diabetes mellitus with hyperglycemia (HCC) - Plan: POCT glycosylated hemoglobin (Hb A1C)  Essential hypertension, benign  Type 2 diabetes mellitus with hyperglycemia, with long-term current use of insulin (HCC) - Plan: LANTUS SOLOSTAR 100 UNIT/ML Solostar Pen    Follow-up plan: Return in about 3 months (around 12/08/2017) for recheck sugars - if not better, will need to add more insulin! Eat right, [name]! .  Visit summary with medication list and pertinent instructions was printed for patient to review, alert us if any changes needed. All questions at time of visit were answered - patient instructed to contact office with any additional concerns. ER/RTC precautions were reviewed with the patient and understanding verbalized.   Note: Total time spent 25 minutes, greater than 50% of the visit was spent face-to-face counseling and coordinating care for the following: The  primary encounter diagnosis was Uncontrolled type 2 diabetes mellitus with hyperglycemia (HCC). Diagnoses of Essential hypertension, benign and Type 2 diabetes mellitus with hyperglycemia, with long-term current use of insulin (HCC) were also pertinent to this visit..Marland Kitchen

## 2017-09-08 ENCOUNTER — Encounter: Payer: Self-pay | Admitting: Osteopathic Medicine

## 2017-09-27 ENCOUNTER — Telehealth: Payer: Self-pay | Admitting: Osteopathic Medicine

## 2017-09-27 MED ORDER — SODIUM CHLORIDE 0.9 % IV SOLN
INTRAVENOUS | Status: DC
Start: ? — End: 2017-09-27

## 2017-09-27 MED ORDER — GENERIC EXTERNAL MEDICATION
Status: DC
Start: ? — End: 2017-09-27

## 2017-09-27 MED ORDER — ENOXAPARIN SODIUM 100 MG/ML ~~LOC~~ SOLN
1.00 | SUBCUTANEOUS | Status: DC
Start: 2017-09-27 — End: 2017-09-27

## 2017-09-27 MED ORDER — METOPROLOL SUCCINATE ER 25 MG PO TB24
25.00 | ORAL_TABLET | ORAL | Status: DC
Start: 2017-09-28 — End: 2017-09-27

## 2017-09-27 MED ORDER — ATORVASTATIN CALCIUM 80 MG PO TABS
80.00 | ORAL_TABLET | ORAL | Status: DC
Start: 2017-09-27 — End: 2017-09-27

## 2017-09-27 MED ORDER — ALBUTEROL SULFATE (2.5 MG/3ML) 0.083% IN NEBU
2.50 | INHALATION_SOLUTION | RESPIRATORY_TRACT | Status: DC
Start: ? — End: 2017-09-27

## 2017-09-27 MED ORDER — ISOSORBIDE MONONITRATE ER 30 MG PO TB24
60.00 | ORAL_TABLET | ORAL | Status: DC
Start: 2017-09-28 — End: 2017-09-27

## 2017-09-27 MED ORDER — ACETAMINOPHEN 325 MG PO TABS
650.00 | ORAL_TABLET | ORAL | Status: DC
Start: ? — End: 2017-09-27

## 2017-09-27 MED ORDER — ASPIRIN EC 81 MG PO TBEC
81.00 | DELAYED_RELEASE_TABLET | ORAL | Status: DC
Start: 2017-09-28 — End: 2017-09-27

## 2017-09-27 MED ORDER — NITROGLYCERIN 0.4 MG SL SUBL
0.40 | SUBLINGUAL_TABLET | SUBLINGUAL | Status: DC
Start: ? — End: 2017-09-27

## 2017-09-27 MED ORDER — RANOLAZINE ER 500 MG PO TB12
500.00 | ORAL_TABLET | ORAL | Status: DC
Start: 2017-09-27 — End: 2017-09-27

## 2017-09-27 MED ORDER — POLYETHYLENE GLYCOL 3350 17 G PO PACK
17.00 | PACK | ORAL | Status: DC
Start: ? — End: 2017-09-27

## 2017-09-27 MED ORDER — INSULIN GLARGINE 100 UNIT/ML ~~LOC~~ SOLN
SUBCUTANEOUS | Status: DC
Start: 2017-09-27 — End: 2017-09-27

## 2017-09-27 NOTE — Telephone Encounter (Signed)
FYI - [facility].

## 2017-09-27 NOTE — Telephone Encounter (Signed)
Noted, thanks!

## 2017-10-06 ENCOUNTER — Ambulatory Visit: Payer: Medicare Other | Admitting: Osteopathic Medicine

## 2017-10-13 ENCOUNTER — Encounter: Payer: Self-pay | Admitting: Osteopathic Medicine

## 2017-10-13 ENCOUNTER — Ambulatory Visit (INDEPENDENT_AMBULATORY_CARE_PROVIDER_SITE_OTHER): Payer: Medicare Other | Admitting: Osteopathic Medicine

## 2017-10-13 VITALS — BP 126/85 | HR 79 | Temp 98.3°F | Wt 193.0 lb

## 2017-10-13 DIAGNOSIS — E1159 Type 2 diabetes mellitus with other circulatory complications: Secondary | ICD-10-CM

## 2017-10-13 DIAGNOSIS — I25118 Atherosclerotic heart disease of native coronary artery with other forms of angina pectoris: Secondary | ICD-10-CM | POA: Diagnosis not present

## 2017-10-13 DIAGNOSIS — Z794 Long term (current) use of insulin: Secondary | ICD-10-CM

## 2017-10-13 DIAGNOSIS — I10 Essential (primary) hypertension: Secondary | ICD-10-CM | POA: Diagnosis not present

## 2017-10-13 DIAGNOSIS — I2699 Other pulmonary embolism without acute cor pulmonale: Secondary | ICD-10-CM | POA: Diagnosis not present

## 2017-10-13 MED ORDER — APIXABAN 5 MG PO TABS: 5.0000 mg | ORAL_TABLET | Freq: Two times a day (BID) | ORAL | 1 refills | Status: DC

## 2017-10-13 NOTE — Progress Notes (Signed)
HPI: Matthew Coffey is a 84 y.o. male who  has a past medical history of Diabetes mellitus without complication (HCC), Hyperlipidemia, and Hypertension.  he presents to [facility] today, 10/13/17,  for chief complaint of:  Chief Complaint  Patient presents with  . Follow-up - hospitalization     Hospitalization for Pulm Embolism - sounds like he got to the hospital just in time, O2 dropped in the ER. Echo okay, started on Eliquis and tolerating this well. Still on ASA. No issues with breathing at this time, though notices he is still fatigued since being out of the hospital, but getting better slowly      Past medical, surgical, social and family history reviewed:  Patient Active Problem List   Diagnosis Date Noted  . Coronary artery disease 11/01/2016  . Right bundle branch block 11/01/2016  . Aortic stenosis 11/01/2016  . ACS (acute coronary syndrome) (HCC) 07/11/2016  . Non-STEMI (non-ST elevated myocardial infarction) (HCC) 07/11/2016  . Thrombocytopenia (HCC) 07/11/2016  . Left knee pain 12/19/2015  . History of nephrectomy 09/16/2015  . Type II diabetes mellitus, uncontrolled (HCC) 09/10/2015  . Hyperlipidemia 09/10/2015  . Essential hypertension, benign 09/10/2015  . Chronic kidney disease, stage 3 (HCC) 09/10/2015  . Arthritis, senescent 09/10/2015    No past surgical history on file.  Social History   Tobacco Use  . Smoking status: Former Smoker    Last attempt to quit: 10/12/1973    Years since quitting: 44.0  . Smokeless tobacco: Never Used  Substance Use Topics  . Alcohol use: No    Family History  Problem Relation Age of Onset  . Heart attack Mother      Current medication list and allergy/intolerance information reviewed:    Current Outpatient Medications  Medication Sig Dispense Refill  . AMBULATORY NON FORMULARY MEDICATION Glucometer testing strips (One Touch Ultra): Use to check blood sugar up to three times a  day. Dx: Insulin Dependent Type 2 Diabetes, E11.9,Z79.4 50 Units 11  . apixaban (ELIQUIS) 5 MG TABS tablet 10 mg (2 tablets) oral 2 times a day x 7 days followed by 5 mg (1 tablet) 2 times a day for completion of therapy duration.    Marland Kitchen aspirin 81 MG chewable tablet Chew 1 tablet (81 mg total) by mouth daily. 90 tablet 3  . atorvastatin (LIPITOR) 80 MG tablet Take 1 tablet (80 mg total) by mouth daily. 90 tablet 3  . Insulin Pen Needle 31G X 5 MM MISC Use fresh needle to inject insulin subcutaneously daily. Dx: Insulin Dependent Type 2 Diabetes E11.9, Z79.4 100 each 99  . IRON PO Take by mouth.    . isosorbide mononitrate (IMDUR) 30 MG 24 hr tablet Take 1 tablet (30 mg total) by mouth daily. (Patient taking differently: Take 60 mg by mouth daily. ) 90 tablet 3  . LANTUS SOLOSTAR 100 UNIT/ML Solostar Pen Inject 55 Units into the skin daily. 45 mL 3  . metoprolol succinate (TOPROL-XL) 25 MG 24 hr tablet Take 1 tablet (25 mg total) by mouth daily. 90 tablet 3  . ranolazine (RANEXA) 500 MG 12 hr tablet Take by mouth.    . Vitamin D, Ergocalciferol, (DRISDOL) 50000 units CAPS capsule TAKE ONE CAPSULE BY MOUTH ONCE EVERY 7 DAYS 12 capsule 3  . nitroGLYCERIN (NITROSTAT) 0.4 MG SL tablet Place 0.4 mg under the tongue as needed for chest pain.     No current facility-administered medications for this visit.  No Known Allergies    Review of Systems:  Constitutional:  No  fever, no chills,   HEENT: No  headache, no vision change  Cardiac: No  chest pain, No  pressure, No palpitations, No  Orthopnea  Respiratory:  No  shortness of breath. No  Cough  Gastrointestinal: No  abdominal pain, No  nausea  Hem/Onc: No  easy bruising/bleeding  Neurologic: No  weakness, No  dizziness   Exam:  BP 126/85   Pulse 79   Temp 98.3 F (36.8 C) (Oral)   Wt 193 lb 0.6 oz (87.6 kg)   BMI 29.35 kg/m   Constitutional: VS see above. General Appearance: alert, well-developed, well-nourished, NAD  Eyes:  Normal lids and conjunctive, non-icteric sclera  Ears, Nose, Mouth, Throat: MMM, Normal external inspection ears/nares/mouth/lips/gums.   Neck: No masses, trachea midline.   Respiratory: Normal respiratory effort. no wheeze, no rhonchi, no rales   Cardiovascular: S1/S2 normal no rub/gallop auscultated. RRR.   Neurological: Normal balance/coordination. No tremor.   Psychiatric: Normal judgment/insight. Normal mood and affect. Oriented x3.    ASSESSMENT/PLAN:   Other acute pulmonary embolism without acute cor pulmonale (HCC) - Plan: CBC, COMPLETE METABOLIC PANEL WITH GFR  Type 2 diabetes mellitus with other circulatory complication, with long-term current use of insulin (HCC) - insulin regimen adjusted, pt is finally getting serious about diet modifications, advised on cautions for hypoglycemia, f/u A1C next few months  Essential hypertension, benign  Coronary artery disease of native artery of native heart with stable angina pectoris [facility](HCC) - recent cardiology notes reviewed, stable, would d/c ASA unless told otherwise by cardio - will forward note to Dr Leeann Mustenaldo to update him     Patient Instructions   STOP aspirin   Since there was no good reason for the clot to happen, it is recommended you stay on the blood thinners indefinitely. Please check with your pharmacy to make sure the Eliquis is affordable for you, and let me know if any problems.     Visit summary with medication list and pertinent instructions was printed for patient to review. All questions at time of visit were answered - patient instructed to contact office with any additional concerns. ER/RTC precautions were reviewed with the patient.   Follow-up plan: Return in about 2 months (around 12/11/2017) for recheck diabetes .  Note: Total time spent 25 minutes, greater than 50% of the visit was spent face-to-face counseling and coordinating care for the following: The primary encounter diagnosis was Other acute  pulmonary embolism without acute cor pulmonale (HCC). Diagnoses of Type 2 diabetes mellitus with other circulatory complication, with long-term current use of insulin (HCC), Essential hypertension, benign, and Coronary artery disease of native artery of native heart with stable angina pectoris [facility](HCC) were also pertinent to this visit.Marland Kitchen.  Please note: voice recognition software was used to produce this document, and typos may escape review. Please contact Dr. Lyn HollingsheadAlexander for any needed clarifications.

## 2017-10-13 NOTE — Patient Instructions (Addendum)
   STOP aspirin   Since there was no good reason for the clot to happen, it is recommended you stay on the blood thinners indefinitely. Please check with your pharmacy to make sure the Eliquis is affordable for you, and let me know if any problems.

## 2017-10-14 LAB — CBC
HCT: 37 % — ABNORMAL LOW (ref 38.5–50.0)
Hemoglobin: 12.4 g/dL — ABNORMAL LOW (ref 13.2–17.1)
MCH: 30.5 pg (ref 27.0–33.0)
MCHC: 33.5 g/dL (ref 32.0–36.0)
MCV: 91.1 fL (ref 80.0–100.0)
MPV: 11.5 fL (ref 7.5–12.5)
Platelets: 192 10*3/uL (ref 140–400)
RBC: 4.06 10*6/uL — ABNORMAL LOW (ref 4.20–5.80)
RDW: 13.7 % (ref 11.0–15.0)
WBC: 7.4 10*3/uL (ref 3.8–10.8)

## 2017-10-14 LAB — COMPLETE METABOLIC PANEL WITH GFR
AG Ratio: 1.5 (calc) (ref 1.0–2.5)
ALT: 23 U/L (ref 9–46)
AST: 18 U/L (ref 10–35)
Albumin: 3.7 g/dL (ref 3.6–5.1)
Alkaline phosphatase (APISO): 47 U/L (ref 40–115)
BILIRUBIN TOTAL: 0.9 mg/dL (ref 0.2–1.2)
BUN/Creatinine Ratio: 11 (calc) (ref 6–22)
BUN: 19 mg/dL (ref 7–25)
CHLORIDE: 103 mmol/L (ref 98–110)
CO2: 28 mmol/L (ref 20–32)
Calcium: 9 mg/dL (ref 8.6–10.3)
Creat: 1.67 mg/dL — ABNORMAL HIGH (ref 0.70–1.11)
GFR, Est African American: 43 mL/min/{1.73_m2} — ABNORMAL LOW (ref >=60)
GFR, Est Non African American: 37 mL/min/{1.73_m2} — ABNORMAL LOW (ref >=60)
GLUCOSE: 187 mg/dL — AB (ref 65–99)
Globulin: 2.4 g/dL (calc) (ref 1.9–3.7)
Potassium: 4.2 mmol/L (ref 3.5–5.3)
Sodium: 138 mmol/L (ref 135–146)
Total Protein: 6.1 g/dL (ref 6.1–8.1)

## 2017-10-15 ENCOUNTER — Other Ambulatory Visit: Payer: Self-pay | Admitting: Osteopathic Medicine

## 2017-10-15 DIAGNOSIS — D649 Anemia, unspecified: Secondary | ICD-10-CM

## 2017-10-15 NOTE — Progress Notes (Signed)
Recheck anemia  

## 2017-10-21 LAB — CBC
HCT: 37.9 % — ABNORMAL LOW (ref 38.5–50.0)
Hemoglobin: 12.7 g/dL — ABNORMAL LOW (ref 13.2–17.1)
MCH: 30.5 pg (ref 27.0–33.0)
MCHC: 33.5 g/dL (ref 32.0–36.0)
MCV: 91.1 fL (ref 80.0–100.0)
MPV: 11.2 fL (ref 7.5–12.5)
PLATELETS: 169 10*3/uL (ref 140–400)
RBC: 4.16 10*6/uL — ABNORMAL LOW (ref 4.20–5.80)
RDW: 13.8 % (ref 11.0–15.0)
WBC: 5.7 10*3/uL (ref 3.8–10.8)

## 2017-10-21 LAB — IRON,TIBC AND FERRITIN PANEL
%SAT: 39 % (ref 15–60)
FERRITIN: 58 ng/mL (ref 20–380)
Iron: 99 ug/dL (ref 50–180)
TIBC: 253 mcg/dL (calc) (ref 250–425)

## 2017-10-28 ENCOUNTER — Telehealth: Payer: Self-pay | Admitting: Osteopathic Medicine

## 2017-10-28 DIAGNOSIS — D649 Anemia, unspecified: Secondary | ICD-10-CM

## 2017-10-28 NOTE — Telephone Encounter (Signed)
Labs ordered for future visit.

## 2017-12-08 ENCOUNTER — Encounter: Payer: Self-pay | Admitting: Osteopathic Medicine

## 2017-12-08 ENCOUNTER — Ambulatory Visit: Payer: Medicare Other | Admitting: Osteopathic Medicine

## 2017-12-08 ENCOUNTER — Ambulatory Visit (INDEPENDENT_AMBULATORY_CARE_PROVIDER_SITE_OTHER): Payer: Medicare Other | Admitting: Osteopathic Medicine

## 2017-12-08 VITALS — BP 126/60 | HR 75 | Temp 98.5°F | Wt 191.1 lb

## 2017-12-08 DIAGNOSIS — Z794 Long term (current) use of insulin: Secondary | ICD-10-CM

## 2017-12-08 DIAGNOSIS — I10 Essential (primary) hypertension: Secondary | ICD-10-CM | POA: Diagnosis not present

## 2017-12-08 DIAGNOSIS — I2510 Atherosclerotic heart disease of native coronary artery without angina pectoris: Secondary | ICD-10-CM | POA: Diagnosis not present

## 2017-12-08 DIAGNOSIS — N183 Chronic kidney disease, stage 3 unspecified: Secondary | ICD-10-CM

## 2017-12-08 DIAGNOSIS — Z86711 Personal history of pulmonary embolism: Secondary | ICD-10-CM

## 2017-12-08 DIAGNOSIS — E1165 Type 2 diabetes mellitus with hyperglycemia: Secondary | ICD-10-CM

## 2017-12-08 LAB — POCT GLYCOSYLATED HEMOGLOBIN (HGB A1C): Hemoglobin A1C: 8.7

## 2017-12-08 MED ORDER — ISOSORBIDE MONONITRATE ER 30 MG PO TB24: 60.0000 mg | ORAL_TABLET | Freq: Every day | ORAL | 3 refills | Status: DC

## 2017-12-08 MED ORDER — LANTUS SOLOSTAR 100 UNIT/ML ~~LOC~~ SOPN: 30.0000 [IU] | PEN_INJECTOR | Freq: Two times a day (BID) | SUBCUTANEOUS | 3 refills | Status: DC

## 2017-12-08 NOTE — Patient Instructions (Signed)
Can increase Lantus to 30-35 units twice per day as long as no drops in blood sugar Watch the sugars/carbs!

## 2017-12-08 NOTE — Progress Notes (Signed)
HPI: Matthew Coffey is a 84 y.o. male  who presents to [facility] today, 12/08/17,  for chief complaint of:  No chief complaint on file.   Diabetes: A1c today shows some worsening from when last checked. He is . No hypoglycemia. Not at all compliant with diabetic diet. He has concerns about caring for his wife, he does not take very good care of his own sugars and he admits this. He is however very reluctant to change medications at this point. Marland Kitchen 01/05/17: 9.1% . 06/02/17: 8.9%  09/07/17: 9.7% - on 50 units of long-acting insulin daily, was previously on glipizide. We discussed going up to 55 units daily and reiterated dietary measures, he has been nonadherent to diabetic diet.   12/08/17: 8.7% - he had increased his Lantus to 30 units bid  Hypertension: blood pressure well controlled on current medications, no home blood pressures report. No chest pain, pressure, shortness of breath.    Past medical history, surgical history, social history and family history reviewed.  Patient Active Problem List   Diagnosis Date Noted  . Coronary artery disease 11/01/2016  . Right bundle branch block 11/01/2016  . Aortic stenosis 11/01/2016  . ACS (acute coronary syndrome) (HCC) 07/11/2016  . Non-STEMI (non-ST elevated myocardial infarction) (HCC) 07/11/2016  . Thrombocytopenia (HCC) 07/11/2016  . Left knee pain 12/19/2015  . History of nephrectomy 09/16/2015  . Type II diabetes mellitus, uncontrolled (HCC) 09/10/2015  . Hyperlipidemia 09/10/2015  . Essential hypertension, benign 09/10/2015  . Chronic kidney disease, stage 3 (HCC) 09/10/2015  . Arthritis, senescent 09/10/2015    Current medication list and allergy/intolerance information reviewed.   Current Outpatient Medications on File Prior to Visit  Medication Sig Dispense Refill  . AMBULATORY NON FORMULARY MEDICATION Glucometer testing strips (One Touch Ultra): Use to check blood sugar up to three  times a day. Dx: Insulin Dependent Type 2 Diabetes, E11.9,Z79.4 50 Units 11  . apixaban (ELIQUIS) 5 MG TABS tablet Take 1 tablet (5 mg total) by mouth 2 (two) times daily. 180 tablet 1  . aspirin 81 MG chewable tablet Chew 1 tablet (81 mg total) by mouth daily. 90 tablet 3  . atorvastatin (LIPITOR) 80 MG tablet Take 1 tablet (80 mg total) by mouth daily. 90 tablet 3  . Insulin Pen Needle 31G X 5 MM MISC Use fresh needle to inject insulin subcutaneously daily. Dx: Insulin Dependent Type 2 Diabetes E11.9, Z79.4 100 each 99  . IRON PO Take by mouth.    . isosorbide mononitrate (IMDUR) 30 MG 24 hr tablet Take 1 tablet (30 mg total) by mouth daily. (Patient taking differently: Take 60 mg by mouth daily. ) 90 tablet 3  . LANTUS SOLOSTAR 100 UNIT/ML Solostar Pen Inject 55 Units into the skin daily. 45 mL 3  . metoprolol succinate (TOPROL-XL) 25 MG 24 hr tablet Take 1 tablet (25 mg total) by mouth daily. 90 tablet 3  . ranolazine (RANEXA) 500 MG 12 hr tablet Take by mouth.    . Vitamin D, Ergocalciferol, (DRISDOL) 50000 units CAPS capsule TAKE ONE CAPSULE BY MOUTH ONCE EVERY 7 DAYS 12 capsule 3  . nitroGLYCERIN (NITROSTAT) 0.4 MG SL tablet Place 0.4 mg under the tongue as needed for chest pain.     No current facility-administered medications on file prior to visit.    No Known Allergies    Review of Systems:  Constitutional: No recent illness  HEENT: No  headache, no vision change  Cardiac: No  chest pain, No  pressure, No palpitations  Respiratory:  No  shortness of breath. No  Cough  Gastrointestinal: No  abdominal pain, no change on bowel habits  Skin: No  Rash  Hem/Onc: No  easy bruising/bleeding, No  abnormal lumps/bumps  Neurologic: No  weakness, No  Dizziness  Psychiatric: No  concerns with depression, No  concerns with anxiety  Exam:  BP 126/60   Pulse 75   Temp 98.5 F (36.9 C) (Oral)   Wt 191 lb 1.9 oz (86.7 kg)   BMI 29.06 kg/m   Constitutional: VS see above.  General Appearance: alert, well-developed, well-nourished, NAD  Eyes: Normal lids and conjunctive, non-icteric sclera  Ears, Nose, Mouth, Throat: MMM, Normal external inspection ears/nares/mouth/lips/gums.  Neck: No masses, trachea midline.   Respiratory: Normal respiratory effort. no wheeze, no rhonchi, no rales  Cardiovascular: S1/S2 normal, no murmur, no rub/gallop auscultated. RRR.   Musculoskeletal: Gait normal. Symmetric and independent movement of all extremities  Neurological: Normal balance/coordination. No tremor.  Skin: warm, dry, intact.   Psychiatric: Normal judgment/insight. Normal mood and affect. Oriented x3.    Recent Results (from the past 2160 hour(s))  CBC     Status: Abnormal   Collection Time: 10/13/17  3:35 PM  Result Value Ref Range   WBC 7.4 3.8 - 10.8 Thousand/uL   RBC 4.06 (L) 4.20 - 5.80 Million/uL   Hemoglobin 12.4 (L) 13.2 - 17.1 g/dL   HCT 09.8 (L) 11.9 - 14.7 %   MCV 91.1 80.0 - 100.0 fL   MCH 30.5 27.0 - 33.0 pg   MCHC 33.5 32.0 - 36.0 g/dL   RDW 82.9 56.2 - 13.0 %   Platelets 192 140 - 400 Thousand/uL   MPV 11.5 7.5 - 12.5 fL  COMPLETE METABOLIC PANEL WITH GFR     Status: Abnormal   Collection Time: 10/13/17  3:35 PM  Result Value Ref Range   Glucose, Bld 187 (H) 65 - 99 mg/dL    Comment: .            Fasting reference interval . For someone without known diabetes, a glucose value >125 mg/dL indicates that they may have diabetes and this should be confirmed with a follow-up test. .    BUN 19 7 - 25 mg/dL   Creat 8.65 (H) 7.84 - 1.11 mg/dL    Comment: For patients >21 years of age, the reference limit for Creatinine is approximately 13% higher for people identified as African-American. .    GFR, Est Non African American 37 (L) > OR = 60 mL/min/1.42m2   GFR, Est African American 43 (L) > OR = 60 mL/min/1.34m2   BUN/Creatinine Ratio 11 6 - 22 (calc)   Sodium 138 135 - 146 mmol/L   Potassium 4.2 3.5 - 5.3 mmol/L   Chloride 103  98 - 110 mmol/L   CO2 28 20 - 32 mmol/L   Calcium 9.0 8.6 - 10.3 mg/dL   Total Protein 6.1 6.1 - 8.1 g/dL   Albumin 3.7 3.6 - 5.1 g/dL   Globulin 2.4 1.9 - 3.7 g/dL (calc)   AG Ratio 1.5 1.0 - 2.5 (calc)   Total Bilirubin 0.9 0.2 - 1.2 mg/dL   Alkaline phosphatase (APISO) 47 40 - 115 U/L   AST 18 10 - 35 U/L   ALT 23 9 - 46 U/L  CBC     Status: Abnormal   Collection Time: 10/20/17  3:26 PM  Result Value Ref Range   WBC 5.7 3.8 -  10.8 Thousand/uL   RBC 4.16 (L) 4.20 - 5.80 Million/uL   Hemoglobin 12.7 (L) 13.2 - 17.1 g/dL   HCT 16.137.9 (L) 09.638.5 - 04.550.0 %   MCV 91.1 80.0 - 100.0 fL   MCH 30.5 27.0 - 33.0 pg   MCHC 33.5 32.0 - 36.0 g/dL   RDW 40.913.8 81.111.0 - 91.415.0 %   Platelets 169 140 - 400 Thousand/uL   MPV 11.2 7.5 - 12.5 fL  Fe+TIBC+Fer     Status: None   Collection Time: 10/20/17  3:26 PM  Result Value Ref Range   Iron 99 50 - 180 mcg/dL   TIBC 782253 956250 - 213425 mcg/dL (calc)   %SAT 39 15 - 60 % (calc)   Ferritin 58 20 - 380 ng/mL      ASSESSMENT/PLAN:   A1C improved but still above 8.0% Patient is again advised that if he doesn't get right about diet/exercise and get the sugars under control, we are going to have to likely start mealtime insulin. For now, can increase Lantus dose, he declines transition to Tuesday which may be a bit better absorbed.  Referral to nephrology initiated first CKD3, patient only has one kidney.  Other medications refilled as below  Uncontrolled type 2 diabetes mellitus with hyperglycemia (HCC) - Plan: POCT HgB A1C  Chronic kidney disease, stage 3 (HCC) - Plan: Ambulatory referral to Nephrology  Coronary artery disease involving native coronary artery of native heart without angina pectoris  History of pulmonary embolism  Essential hypertension, benign - Plan: isosorbide mononitrate (IMDUR) 30 MG 24 hr tablet  Type 2 diabetes mellitus with hyperglycemia, with long-term current use of insulin (HCC) - Plan: LANTUS SOLOSTAR 100 UNIT/ML Solostar  Pen    Follow-up plan: Return in about 3 months (around 03/07/2018) for recheck A1C/diabetes .  Visit summary with medication list and pertinent instructions was printed for patient to review, alert us if any changes needed. All questions at time of visit were answered - patient instructed to contact office with any additional concerns. ER/RTC precautions were reviewed with the patient and understanding verbalized.   Note: Total time spent 25 minutes, greater than 50% of the visit was spent face-to-face counseling and coordinating care for the following: The primary encounter diagnosis was Uncontrolled type 2 diabetes mellitus with hyperglycemia (HCC). Diagnoses of Chronic kidney disease, stage 3 (HCC), Coronary artery disease involving native coronary artery of native heart without angina pectoris, History of pulmonary embolism, Essential hypertension, benign, and Type 2 diabetes mellitus with hyperglycemia, with long-term current use of insulin (HCC) were also pertinent to this visit..Marland Kitchen

## 2017-12-09 ENCOUNTER — Encounter: Payer: Self-pay | Admitting: Osteopathic Medicine

## 2017-12-13 ENCOUNTER — Ambulatory Visit: Payer: Medicare Other | Admitting: Osteopathic Medicine

## 2017-12-17 ENCOUNTER — Other Ambulatory Visit: Payer: Self-pay

## 2017-12-17 NOTE — Telephone Encounter (Signed)
Matthew Coffey requests a refill on nitroglycerin. Historical provider.

## 2017-12-20 MED ORDER — NITROGLYCERIN 0.4 MG SL SUBL: 0.4000 mg | SUBLINGUAL_TABLET | SUBLINGUAL | 5 refills | Status: AC | PRN

## 2018-03-03 ENCOUNTER — Telehealth: Payer: Self-pay | Admitting: Osteopathic Medicine

## 2018-03-03 ENCOUNTER — Telehealth: Payer: Self-pay

## 2018-03-03 NOTE — Telephone Encounter (Signed)
Thanks for the information. Pt has been contacted. Sending direct msg to provider re: inquiry on new DM med.

## 2018-03-03 NOTE — Telephone Encounter (Signed)
We had talked about going up on his Lantus to 35 units twice per day, I think this is what he means? He also was not eating well or exercising at all.  At any rate, will discuss in detail at next visit, he is due to see me in the next week or two.

## 2018-03-03 NOTE — Telephone Encounter (Signed)
Pt called stating that at last visit with provider, it was discuss that an add'l DM med was to be added due to uncontrolled DM. Pt is requesting for new med to be sent to [facility] pharmacy. Pt is aware that a f/u appt is due & will set up appt next week. Pls advise, thanks.

## 2018-03-03 NOTE — Telephone Encounter (Signed)
Pt called and left a voicemail and would like for Dr.Alexander's nurse to call him back because he would like to discuss something with her.

## 2018-03-04 NOTE — Telephone Encounter (Signed)
Left a detailed vm msg for pt re: Lantus med & provider's note. Call back information provided.

## 2018-03-29 ENCOUNTER — Encounter: Payer: Self-pay | Admitting: Osteopathic Medicine

## 2018-03-29 ENCOUNTER — Ambulatory Visit (INDEPENDENT_AMBULATORY_CARE_PROVIDER_SITE_OTHER): Payer: Medicare Other | Admitting: Osteopathic Medicine

## 2018-03-29 VITALS — BP 120/85 | HR 75 | Temp 98.6°F | Wt 190.0 lb

## 2018-03-29 DIAGNOSIS — N183 Chronic kidney disease, stage 3 unspecified: Secondary | ICD-10-CM

## 2018-03-29 DIAGNOSIS — I10 Essential (primary) hypertension: Secondary | ICD-10-CM | POA: Diagnosis not present

## 2018-03-29 DIAGNOSIS — E1165 Type 2 diabetes mellitus with hyperglycemia: Secondary | ICD-10-CM | POA: Diagnosis not present

## 2018-03-29 LAB — POCT GLYCOSYLATED HEMOGLOBIN (HGB A1C): Hemoglobin A1C: 8.8 % — AB (ref 4.0–5.6)

## 2018-03-29 MED ORDER — INSULIN GLARGINE 300 UNIT/ML ~~LOC~~ SOPN: 60.0000 [IU] | PEN_INJECTOR | Freq: Every day | SUBCUTANEOUS | 3 refills | Status: DC

## 2018-03-29 NOTE — Patient Instructions (Signed)
Will change the insulin from Lantus 30 units twice per day to Toujeo 60 Units once per day   CALL ME if there are problems getting this at the pharmacy

## 2018-03-29 NOTE — Progress Notes (Signed)
HPI: Matthew Coffey is a 84 y.o. male who  has a past medical history of Diabetes mellitus without complication (HCC), Hyperlipidemia, and Hypertension.  he presents to [facility] today, 03/29/18,  for chief complaint of:  Diabetes f/u  Diabetes: Not at all compliant with diabetic diet. He has concerns about caring for his wife, he does not take very good care of his own sugars and he admits this. He is however very reluctant to change medications at this point.  01/05/17: 9.1%  06/02/17: 8.9%  09/07/17: 9.7% - on 50 units of long-acting insulin daily, was previously on glipizide. We discussed going up to 55 units daily and reiterated dietary measures, he has been nonadherent to diabetic diet.   12/08/17: 8.7% - he had increased his Lantus to 30 units bid, we decided to go up a bit on this to 35 units bid as long as no drops in Glc  03/29/18: 8.8% -still taking Lantus 30 units twice daily   Hypertension: Blood pressure on initial intake was her low, rechecked on manual cuff did show improvement     Past medical history, surgical history, and family history reviewed.  Current medication list and allergy/intolerance information reviewed.   (See remainder of HPI, ROS, Phys Exam below)  BP 120/85   Pulse 75   Temp 98.6 F (37 C) (Oral)   Wt 190 lb (86.2 kg)   BMI 28.89 kg/m      ASSESSMENT/PLAN:   Uncontrolled type 2 diabetes mellitus with hyperglycemia (HCC) - Once again, discussed importance of diet compliance and insulin titration instructions, will trial Toujeo, consider mealtime insulin if things are not better - Plan: POCT HgB A1C  Essential hypertension, benign  Chronic kidney disease, stage 3 (HCC)   Meds ordered this encounter  Medications  . Insulin Glargine (TOUJEO MAX SOLOSTAR) 300 UNIT/ML SOPN    Sig: Inject 60 Units into the skin daily.    Dispense:  15 mL    Refill:  3    Patient Instructions  Will change the  insulin from Lantus 30 units twice per day to Toujeo 60 Units once per day   CALL ME if there are problems getting this at the pharmacy     Follow-up plan: Return in about 3 months (around 06/29/2018) for follow-up diabetes .     ############################################ ############################################ ############################################ ############################################    Outpatient Encounter Medications as of 03/29/2018  Medication Sig  . AMBULATORY NON FORMULARY MEDICATION Glucometer testing strips (One Touch Ultra): Use to check blood sugar up to three times a day. Dx: Insulin Dependent Type 2 Diabetes, E11.9,Z79.4  . apixaban (ELIQUIS) 5 MG TABS tablet Take 1 tablet (5 mg total) by mouth 2 (two) times daily.  Marland Kitchen atorvastatin (LIPITOR) 80 MG tablet Take 1 tablet (80 mg total) by mouth daily.  . Insulin Pen Needle 31G X 5 MM MISC Use fresh needle to inject insulin subcutaneously daily. Dx: Insulin Dependent Type 2 Diabetes E11.9, Z79.4  . IRON PO Take by mouth.  . isosorbide mononitrate (IMDUR) 30 MG 24 hr tablet Take 2 tablets (60 mg total) by mouth daily.  Marland Kitchen LANTUS SOLOSTAR 100 UNIT/ML Solostar Pen Inject 30-35 Units into the skin 2 (two) times daily.  . metoprolol succinate (TOPROL-XL) 25 MG 24 hr tablet Take 1 tablet (25 mg total) by mouth daily.  . nitroGLYCERIN (NITROSTAT) 0.4 MG SL tablet Place 1 tablet (0.4 mg total) under the tongue as needed for chest pain.  . ranolazine (RANEXA)  500 MG 12 hr tablet Take by mouth.  . Vitamin D, Ergocalciferol, (DRISDOL) 50000 units CAPS capsule TAKE ONE CAPSULE BY MOUTH ONCE EVERY 7 DAYS   No facility-administered encounter medications on file as of 03/29/2018.    No Known Allergies    Review of Systems:  Constitutional: No recent illness  HEENT: No  headache, no vision change  Cardiac: No  chest pain, No  pressure, No palpitations  Respiratory:  No  shortness of breath. No   Cough  Gastrointestinal: No  abdominal pain  Musculoskeletal: No new myalgia/arthralgia  Skin: No  Rash  Neurologic: No  weakness, No  Dizziness  Exam:  BP 120/85   Pulse 75   Temp 98.6 F (37 C) (Oral)   Wt 190 lb (86.2 kg)   BMI 28.89 kg/m   Constitutional: VS see above. General Appearance: alert, well-developed, well-nourished, NAD  Eyes: Normal lids and conjunctive, non-icteric sclera  Ears, Nose, Mouth, Throat: MMM, Normal external inspection ears/nares/mouth/lips/gums.  Neck: No masses, trachea midline.   Respiratory: Normal respiratory effort. no wheeze, no rhonchi, no rales  Cardiovascular: S1/S2 normal, no rub/gallop auscultated. RRR.   Musculoskeletal: Gait normal. Symmetric and independent movement of all extremities  Neurological: Normal balance/coordination.   Skin: warm, dry, intact.   Psychiatric: Fair judgment/insight. Normal mood and affect. Oriented x3.   Visit summary with medication list and pertinent instructions was printed for patient to review, advised to alert us if any changes needed. All questions at time of visit were answered - patient instructed to contact office with any additional concerns. ER/RTC precautions were reviewed with the patient and understanding verbalized.   Follow-up plan: Return in about 3 months (around 06/29/2018) for follow-up diabetes .  Note: Total time spent 25 minutes, greater than 50% of the visit was spent face-to-face counseling and coordinating care for the following: The primary encounter diagnosis was Uncontrolled type 2 diabetes mellitus with hyperglycemia (HCC). Diagnoses of Essential hypertension, benign and Chronic kidney disease, stage 3 (HCC) were also pertinent to this visit.Marland Kitchen.  Please note: voice recognition software was used to produce this document, and typos may escape review. Please contact Dr. Lyn HollingsheadAlexander for any needed clarifications.

## 2018-03-30 ENCOUNTER — Encounter: Payer: Self-pay | Admitting: Osteopathic Medicine

## 2018-04-21 ENCOUNTER — Other Ambulatory Visit: Payer: Self-pay | Admitting: Osteopathic Medicine

## 2018-04-26 ENCOUNTER — Ambulatory Visit (INDEPENDENT_AMBULATORY_CARE_PROVIDER_SITE_OTHER): Payer: Medicare Other

## 2018-04-26 ENCOUNTER — Ambulatory Visit (INDEPENDENT_AMBULATORY_CARE_PROVIDER_SITE_OTHER): Payer: Medicare Other | Admitting: Family Medicine

## 2018-04-26 ENCOUNTER — Encounter: Payer: Self-pay | Admitting: Family Medicine

## 2018-04-26 VITALS — BP 126/54 | HR 75 | Wt 190.0 lb

## 2018-04-26 DIAGNOSIS — M25512 Pain in left shoulder: Secondary | ICD-10-CM

## 2018-04-26 DIAGNOSIS — M19012 Primary osteoarthritis, left shoulder: Secondary | ICD-10-CM

## 2018-04-26 NOTE — Progress Notes (Signed)
Matthew PalmerClaude Coffey is a 84 y.o. male who presents to [facility] today for left shoulder pain.  [name] notes a several day history of worsening left shoulder pain.  He notes the pain is felt the posterior aspect of the shoulder is worse at bedtime and with activity.  He notes a 6 to 12-month history of weakness with overhead motion without significant pain.  He denies any recent injury.  He denies any radiating pain or chest pain.  He tried ibuprofen which did help.  He has had a history of knee DJD and did extremely well with an injection and is interested in a shoulder injection if possible.    ROS:  As above  Exam:  BP (!) 126/54   Pulse 75   Wt 190 lb (86.2 kg)   BMI 28.89 kg/m  General: Well Developed, well nourished, and in no acute distress.  Neuro/Psych: Alert and oriented x3, extra-ocular muscles intact, able to move all 4 extremities, sensation grossly intact. Skin: Warm and dry, no rashes noted.  Respiratory: Not using accessory muscles, speaking in full sentences, trachea midline.  Cardiovascular: Pulses palpable, no extremity edema. Abdomen: Does not appear distended. MSK:  Left shoulder normal-appearing Range of motion significant limitation of active range of motion beyond 70 degrees full passive motion. Normal external/internal rotational range of motion. Significant crepitation with range of motion exam. Strength testing significantly diminished with abduction 3/5 normal internal rotation and external rotation 4/5 Impingement testing mildly positive. Pulses capillary refill and sensation are intact distally.  Procedure: Real-time Ultrasound Guided Injection of left GH joint  Device: GE Logiq E   Images permanently stored and available for review in the ultrasound unit. Verbal informed consent obtained.  Discussed risks and benefits of procedure. Warned about infection bleeding damage to structures skin hypopigmentation and fat  atrophy among others. Patient expresses understanding and agreement Time-out conducted.   Noted no overlying erythema, induration, or other signs of local infection.   Skin prepped in a sterile fashion.   Local anesthesia: Topical Ethyl chloride.   With sterile technique and under real time ultrasound guidance:  40mg  kenalog and 2ml marcaine injected easily.   Completed without difficulty   Pain partially resolved suggesting accurate placement of the medication.   Advised to call if fevers/chills, erythema, induration, drainage, or persistent bleeding.   Images permanently stored and available for review in the ultrasound unit.  Impression: Technically successful ultrasound guided injection.      Lab and Radiology Results  Dg Shoulder Left  Result Date: 04/26/2018 CLINICAL DATA:  84 year old male with acute left shoulder pain for 2 days. Decreased range of motion. No reported injury. Initial encounter. EXAM: LEFT SHOULDER - 2+ VIEW COMPARISON:  None. FINDINGS: Moderate acromioclavicular joint and glenohumeral joint degenerative changes. Spurring undersurface of the acromion may contribute to rotator cuff injury. No fracture or dislocation. No abnormal soft tissue calcifications. Possible granuloma peripheral aspect left lung. Aortic calcifications. IMPRESSION: Moderate acromioclavicular joint and glenohumeral joint degenerative changes. Spurring undersurface of the acromion may contribute to rotator cuff injury. No fracture or dislocation. Aortic Atherosclerosis (ICD10-I70.0). Electronically Signed   By: Lacy DuverneySteven  Olson M.D.   On: 04/26/2018 19:13  I personally (independently) visualized and performed the interpretation of the images attached in this note.     Assessment and Plan: 84 y.o. male with shoulder pain.  Patient with significant crepitations with shoulder range of motion and long-standing weakness to abduction.  His x-ray shows elevation of humeral  head as well as DJD.  I am  concerned for chronic rotator cuff tear of the supraspinatus tendon.  However that is been present for approximately 6 to 8 months based on patient history.  The new pain today I think is due to exacerbation of DJD.  Glenohumeral injection provided moderate pain relief immediately.  I am hopeful that he will have more long-lasting relief with the steroid component of the injection.  Plan for watchful waiting and recheck in 1 month.  If not doing well would consider subacromial injection versus physical therapy.  Continue home exercise program.    Orders Placed This Encounter  Procedures  . DG Shoulder Left    Standing Status:   Future    Standing Expiration Date:   06/28/2019    Order Specific Question:   Reason for Exam (SYMPTOM  OR DIAGNOSIS REQUIRED)    Answer:   left shoulder pain    Order Specific Question:   Preferred imaging location?    Answer:   [facility]    Order Specific Question:   Radiology Contrast Protocol - do NOT remove file path    Answer:   [email or web address]   No orders of the defined types were placed in this encounter.   Historical information moved to improve visibility of documentation.  Past Medical History:  Diagnosis Date  . Diabetes mellitus without complication (HCC)   . Hyperlipidemia   . Hypertension    Past Surgical History:  Procedure Laterality Date  . COLECTOMY  07/1999  . kidney removed Right    Social History   Tobacco Use  . Smoking status: Former Smoker    Last attempt to quit: 10/12/1973    Years since quitting: 44.5  . Smokeless tobacco: Never Used  Substance Use Topics  . Alcohol use: No   family history includes Heart attack in his mother.  Medications: Current Outpatient Medications  Medication Sig Dispense Refill  . AMBULATORY NON FORMULARY MEDICATION Glucometer testing strips (One Touch Ultra): Use to check blood sugar up to three times a day. Dx: Insulin Dependent Type 2  Diabetes, E11.9,Z79.4 50 Units 11  . atorvastatin (LIPITOR) 80 MG tablet Take 1 tablet (80 mg total) by mouth daily. 90 tablet 3  . ELIQUIS 5 MG TABS tablet TAKE 1 TABLET BY MOUTH TWICE DAILY 180 tablet 1  . Insulin Glargine (TOUJEO MAX SOLOSTAR) 300 UNIT/ML SOPN Inject 60 Units into the skin daily. 15 mL 3  . Insulin Pen Needle 31G X 5 MM MISC Use fresh needle to inject insulin subcutaneously daily. Dx: Insulin Dependent Type 2 Diabetes E11.9, Z79.4 100 each 99  . IRON PO Take by mouth.    . isosorbide mononitrate (IMDUR) 30 MG 24 hr tablet Take 2 tablets (60 mg total) by mouth daily. 90 tablet 3  . metoprolol succinate (TOPROL-XL) 25 MG 24 hr tablet Take 1 tablet (25 mg total) by mouth daily. 90 tablet 3  . nitroGLYCERIN (NITROSTAT) 0.4 MG SL tablet Place 1 tablet (0.4 mg total) under the tongue as needed for chest pain. 15 tablet 5  . ranolazine (RANEXA) 500 MG 12 hr tablet Take by mouth.    . Vitamin D, Ergocalciferol, (DRISDOL) 50000 units CAPS capsule TAKE ONE CAPSULE BY MOUTH ONCE EVERY 7 DAYS 12 capsule 3   No current facility-administered medications for this visit.    No Known Allergies    Discussed warning signs or symptoms. Please see discharge instructions. Patient expresses understanding.

## 2018-04-26 NOTE — Patient Instructions (Signed)
Thank you for coming in today. Call or go to the ER if you develop a large red swollen joint with extreme pain or oozing puss.  Recheck in 4 weeks. Return sooner if needed.    Arthritis Arthritis is a term that is commonly used to refer to joint pain or joint disease. There are more than 100 types of arthritis. What are the causes? The most common cause of this condition is wear and tear of a joint. Other causes include:  Gout.  Inflammation of a joint.  An infection of a joint.  Sprains and other injuries near the joint.  A drug reaction or allergic reaction.  In some cases, the cause may not be known. What are the signs or symptoms? The main symptom of this condition is pain in the joint with movement. Other symptoms include:  Redness, swelling, or stiffness at a joint.  Warmth coming from the joint.  Fever.  Overall feeling of illness.  How is this diagnosed? This condition may be diagnosed with a physical exam and tests, including:  Blood tests.  Urine tests.  Imaging tests, such as MRI, X-rays, or a CT scan.  Sometimes, fluid is removed from a joint for testing. How is this treated? Treatment for this condition may involve:  Treatment of the cause, if it is known.  Rest.  Raising (elevating) the joint.  Applying cold or hot packs to the joint.  Medicines to improve symptoms and reduce inflammation.  Injections of a steroid such as cortisone into the joint to help reduce pain and inflammation.  Depending on the cause of your arthritis, you may need to make lifestyle changes to reduce stress on your joint. These changes may include exercising more and losing weight. Follow these instructions at home: Medicines  Take over-the-counter and prescription medicines only as told by your health care provider.  Do not take aspirin to relieve pain if gout is suspected. Activity  Rest your joint if told by your health care provider. Rest is important when  your disease is active and your joint feels painful, swollen, or stiff.  Avoid activities that make the pain worse. It is important to balance activity with rest.  Exercise your joint regularly with range-of-motion exercises as told by your health care provider. Try doing low-impact exercise, such as: ? Swimming. ? Water aerobics. ? Biking. ? Walking. Joint Care   If your joint is swollen, keep it elevated if told by your health care provider.  If your joint feels stiff in the morning, try taking a warm shower.  If directed, apply heat to the joint. If you have diabetes, do not apply heat without permission from your health care provider. ? Put a towel between the joint and the hot pack or heating pad. ? Leave the heat on the area for 20-30 minutes.  If directed, apply ice to the joint: ? Put ice in a plastic bag. ? Place a towel between your skin and the bag. ? Leave the ice on for 20 minutes, 2-3 times per day.  Keep all follow-up visits as told by your health care provider. This is important. Contact a health care provider if:  The pain gets worse.  You have a fever. Get help right away if:  You develop severe joint pain, swelling, or redness.  Many joints become painful and swollen.  You develop severe back pain.  You develop severe weakness in your leg.  You cannot control your bladder or bowels. This information is  not intended to replace advice given to you by your health care provider. Make sure you discuss any questions you have with your health care provider. Document Released: 11/05/2004 Document Revised: 03/05/2016 Document Reviewed: 12/24/2014 Elsevier Interactive Patient Education  [employer].

## 2018-05-04 ENCOUNTER — Ambulatory Visit (INDEPENDENT_AMBULATORY_CARE_PROVIDER_SITE_OTHER): Payer: Medicare Other | Admitting: Family Medicine

## 2018-05-04 ENCOUNTER — Encounter: Payer: Self-pay | Admitting: Family Medicine

## 2018-05-04 VITALS — BP 129/83 | HR 82 | Wt 178.0 lb

## 2018-05-04 DIAGNOSIS — R634 Abnormal weight loss: Secondary | ICD-10-CM | POA: Diagnosis not present

## 2018-05-04 DIAGNOSIS — R739 Hyperglycemia, unspecified: Secondary | ICD-10-CM

## 2018-05-04 DIAGNOSIS — R350 Frequency of micturition: Secondary | ICD-10-CM

## 2018-05-04 LAB — POCT URINALYSIS DIPSTICK
BILIRUBIN UA: NEGATIVE
GLUCOSE UA: POSITIVE — AB
KETONES UA: NEGATIVE
Leukocytes, UA: NEGATIVE
Nitrite, UA: NEGATIVE
PH UA: 5.5 (ref 5.0–8.0)
Protein, UA: NEGATIVE
SPEC GRAV UA: 1.02 (ref 1.010–1.025)
UROBILINOGEN UA: 1 U/dL

## 2018-05-04 NOTE — Progress Notes (Signed)
Matthew PalmerClaude Coffey is a 84 y.o. male who [facility] today for weight loss and increased urination.  Matthew KrasClaude is a medical history significant for moderately controlled diabetes.  His last A1c was 8.8 in June.  He typically uses 30 units of Toujeo twice daily.  He does not check his blood sugars regularly.  He was seen in clinic on July 16 where he received a left shoulder intra-articular steroid injection consisting of 40 mg of Kenalog.  He notes since then he has had increased urination and notes were a recent weight loss.  He denies feeling lightheadedness or dizziness.  He feels well otherwise with no fevers or chills nausea vomiting or diarrhea. No dysuria or pain with urination.  No blood in the urine.  ROS as above:  Exam:  BP 129/83   Pulse 82   Wt 178 lb (80.7 kg)   BMI 27.06 kg/m   Wt Readings from Last 5 Encounters:  05/04/18 178 lb (80.7 kg)  04/26/18 190 lb (86.2 kg)  03/29/18 190 lb (86.2 kg)  12/08/17 191 lb 1.9 oz (86.7 kg)  10/13/17 193 lb 0.6 oz (87.6 kg)    Gen: Well NAD HEENT: EOMI,  MMM Lungs: Normal work of breathing. CTABL Heart: RRR no MRG Abd: NABS, Soft. Nondistended, Nontender Exts: Brisk capillary refill, warm and well perfused.  Skin: Normal turgor  Lab and Radiology Results Results for orders placed or performed in visit on 05/04/18 (from the past 72 hour(s))  POCT Urinalysis Dipstick     Status: Abnormal   Collection Time: 05/04/18  2:48 PM  Result Value Ref Range   Color, UA yellow    Clarity, UA clear    Glucose, UA Positive (A) Negative   Bilirubin, UA negative    Ketones, UA negative    Spec Grav, UA 1.020 1.010 - 1.025   Blood, UA trace    pH, UA 5.5 5.0 - 8.0   Protein, UA Negative Negative   Urobilinogen, UA 1.0 0.2 or 1.0 E.U./dL   Nitrite, UA negative    Leukocytes, UA Negative Negative   Appearance     Odor     No results found.   POCT CBG HIGH (out of range)  Assessment and Plan:  84 y.o. male with weight loss and polyuria very likely due to hyperglycemia.  Point-of-care fingerstick blood glucose elevated out of range today.  Patient had recent exposure to steroids and I think is having elevated blood sugar causing polyuria and weight loss.  Fortunately he is asymptomatic and doing quite well.  Plan to continue good oral hydration avoiding sugar sweetened beverages.  Additionally check CBC and metabolic panel today. Increase Toujeo to a total of 70 units daily and report back tomorrow home blood sugar checks.  Recommend patient check blood sugar 2-3 times daily.  Follow-up with me early next week or sooner if needed.  Plan may change based on metabolic panel.   Orders Placed This Encounter  Procedures  . CBC  . COMPLETE METABOLIC PANEL WITH GFR  . POCT Urinalysis Dipstick   No orders of the defined types were placed in this encounter.    Historical information moved to improve visibility of documentation.  Past Medical History:  Diagnosis Date  . Diabetes mellitus without complication (HCC)   . Hyperlipidemia   . Hypertension    Past Surgical History:  Procedure Laterality Date  . COLECTOMY  07/1999  .  kidney removed Right    Social History   Tobacco Use  . Smoking status: Former Smoker    Last attempt to quit: 10/12/1973    Years since quitting: 44.5  . Smokeless tobacco: Never Used  Substance Use Topics  . Alcohol use: No   family history includes Heart attack in his mother.  Medications: Current Outpatient Medications  Medication Sig Dispense Refill  . AMBULATORY NON FORMULARY MEDICATION Glucometer testing strips (One Touch Ultra): Use to check blood sugar up to three times a day. Dx: Insulin Dependent Type 2 Diabetes, E11.9,Z79.4 50 Units 11  . atorvastatin (LIPITOR) 80 MG tablet Take 1 tablet (80 mg total) by mouth daily. 90 tablet 3  . ELIQUIS 5 MG TABS  tablet TAKE 1 TABLET BY MOUTH TWICE DAILY 180 tablet 1  . Insulin Glargine (TOUJEO MAX SOLOSTAR) 300 UNIT/ML SOPN Inject 60 Units into the skin daily. 15 mL 3  . Insulin Pen Needle 31G X 5 MM MISC Use fresh needle to inject insulin subcutaneously daily. Dx: Insulin Dependent Type 2 Diabetes E11.9, Z79.4 100 each 99  . IRON PO Take by mouth.    . isosorbide mononitrate (IMDUR) 30 MG 24 hr tablet Take 2 tablets (60 mg total) by mouth daily. 90 tablet 3  . metoprolol succinate (TOPROL-XL) 25 MG 24 hr tablet Take 1 tablet (25 mg total) by mouth daily. 90 tablet 3  . nitroGLYCERIN (NITROSTAT) 0.4 MG SL tablet Place 1 tablet (0.4 mg total) under the tongue as needed for chest pain. 15 tablet 5  . ranolazine (RANEXA) 500 MG 12 hr tablet Take by mouth.    . Vitamin D, Ergocalciferol, (DRISDOL) 50000 units CAPS capsule TAKE ONE CAPSULE BY MOUTH ONCE EVERY 7 DAYS 12 capsule 3   No current facility-administered medications for this visit.    No Known Allergies   Discussed warning signs or symptoms. Please see discharge instructions. Patient expresses understanding.

## 2018-05-04 NOTE — Patient Instructions (Addendum)
Thank you for coming in today. Increase the Toujeo to 35 units twice daily.  Recheck with me early next week.  Get blood lab today.  Drink plenty of water.  Avoid sugar like soda a sweet tea or juice.   Let me know tomorrow what your sugar is.   Get labs now.    Hyperglycemia Hyperglycemia is when the sugar (glucose) level in your blood is too high. It may not cause symptoms. If you do have symptoms, they may include warning signs, such as:  Feeling more thirsty than normal.  Hunger.  Feeling tired.  Needing to pee (urinate) more than normal.  Blurry eyesight (vision).  You may get other symptoms as it gets worse, such as:  Dry mouth.  Not being hungry (loss of appetite).  Fruity-smelling breath.  Weakness.  Weight gain or loss that is not planned. Weight loss may be fast.  A tingling or numb feeling in your hands or feet.  Headache.  Skin that does not bounce back quickly when it is lightly pinched and released (poor skin turgor).  Pain in your belly (abdomen).  Cuts or bruises that heal slowly.  High blood sugar can happen to people who do or do not have diabetes. High blood sugar can happen slowly or quickly, and it can be an emergency. Follow these instructions at home: General instructions  Take over-the-counter and prescription medicines only as told by your doctor.  Do not use products that contain nicotine or tobacco, such as cigarettes and e-cigarettes. If you need help quitting, ask your doctor.  Limit alcohol intake to no more than 1 drink per day for nonpregnant women and 2 drinks per day for men. One drink equals 12 oz of beer, 5 oz of wine, or 1 oz of hard liquor.  Manage stress. If you need help with this, ask your doctor.  Keep all follow-up visits as told by your doctor. This is important. Eating and drinking  Stay at a healthy weight.  Exercise regularly, as told by your doctor.  Drink enough fluid, especially when  you: ? Exercise. ? Get sick. ? Are in hot temperatures.  Eat healthy foods, such as: ? Low-fat (lean) proteins. ? Complex carbs (complex carbohydrates), such as whole wheat bread or brown rice. ? Fresh fruits and vegetables. ? Low-fat dairy products. ? Healthy fats.  Drink enough fluid to keep your pee (urine) clear or pale yellow. If you have diabetes:  Make sure you know the symptoms of hyperglycemia.  Follow your diabetes management plan, as told by your doctor. Make sure you: ? Take insulin and medicines as told. ? Follow your exercise plan. ? Follow your meal plan. Eat on time. Do not skip meals. ? Check your blood sugar as often as told. Make sure to check before and after exercise. If you exercise longer or in a different way than you normally do, check your blood sugar more often. ? Follow your sick day plan whenever you cannot eat or drink normally. Make this plan ahead of time with your doctor.  Share your diabetes management plan with people in your workplace, school, and household.  Check your urine for ketones when you are ill and as told by your doctor.  Carry a card or wear jewelry that says that you have diabetes. Contact a doctor if:  Your blood sugar level is higher than 240 mg/dL (16.113.3 mmol/L) for 2 days in a row.  You have problems keeping your blood sugar in your  target range.  High blood sugar happens often for you. Get help right away if:  You have trouble breathing.  You have a change in how you think, feel, or act (mental status).  You feel sick to your stomach (nauseous), and that feeling does not go away.  You cannot stop throwing up (vomiting). These symptoms may be an emergency. Do not wait to see if the symptoms will go away. Get medical help right away. Call your local emergency services (911 in the [place] Do not drive yourself to the hospital. Summary  Hyperglycemia is when the sugar (glucose) level in your blood is too high.  High  blood sugar can happen to people who do or do not have diabetes.  Make sure you drink enough fluids, eat healthy foods, and exercise regularly.  Contact your doctor if you have problems keeping your blood sugar in your target range. This information is not intended to replace advice given to you by your health care provider. Make sure you discuss any questions you have with your health care provider. Document Released: 07/26/2009 Document Revised: 06/15/2016 Document Reviewed: 06/15/2016 Elsevier Interactive Patient Education  2017 [employer].

## 2018-05-05 LAB — COMPLETE METABOLIC PANEL WITH GFR
AG Ratio: 1.5 (calc) (ref 1.0–2.5)
ALBUMIN MSPROF: 3.7 g/dL (ref 3.6–5.1)
ALT: 35 U/L (ref 9–46)
AST: 17 U/L (ref 10–35)
Alkaline phosphatase (APISO): 53 U/L (ref 40–115)
BUN / CREAT RATIO: 21 (calc) (ref 6–22)
BUN: 42 mg/dL — AB (ref 7–25)
CO2: 25 mmol/L (ref 20–32)
CREATININE: 1.97 mg/dL — AB (ref 0.70–1.11)
Calcium: 9.1 mg/dL (ref 8.6–10.3)
Chloride: 100 mmol/L (ref 98–110)
GFR, EST AFRICAN AMERICAN: 35 mL/min/{1.73_m2} — AB (ref >=60)
GFR, Est Non African American: 30 mL/min/{1.73_m2} — ABNORMAL LOW (ref >=60)
GLUCOSE: 585 mg/dL — AB (ref 65–99)
Globulin: 2.5 g/dL (calc) (ref 1.9–3.7)
Potassium: 5.2 mmol/L (ref 3.5–5.3)
Sodium: 135 mmol/L (ref 135–146)
TOTAL PROTEIN: 6.2 g/dL (ref 6.1–8.1)
Total Bilirubin: 1.5 mg/dL — ABNORMAL HIGH (ref 0.2–1.2)

## 2018-05-05 LAB — CBC
HCT: 47 % (ref 38.5–50.0)
HEMOGLOBIN: 15.6 g/dL (ref 13.2–17.1)
MCH: 31.6 pg (ref 27.0–33.0)
MCHC: 33.2 g/dL (ref 32.0–36.0)
MCV: 95.3 fL (ref 80.0–100.0)
MPV: 12 fL (ref 7.5–12.5)
Platelets: 174 10*3/uL (ref 140–400)
RBC: 4.93 10*6/uL (ref 4.20–5.80)
RDW: 12.2 % (ref 11.0–15.0)
WBC: 9.1 10*3/uL (ref 3.8–10.8)

## 2018-05-11 ENCOUNTER — Ambulatory Visit (INDEPENDENT_AMBULATORY_CARE_PROVIDER_SITE_OTHER): Payer: Medicare Other | Admitting: Family Medicine

## 2018-05-11 ENCOUNTER — Encounter: Payer: Self-pay | Admitting: Family Medicine

## 2018-05-11 VITALS — BP 123/77 | HR 81 | Wt 178.0 lb

## 2018-05-11 DIAGNOSIS — R350 Frequency of micturition: Secondary | ICD-10-CM

## 2018-05-11 DIAGNOSIS — R739 Hyperglycemia, unspecified: Secondary | ICD-10-CM

## 2018-05-11 DIAGNOSIS — E1165 Type 2 diabetes mellitus with hyperglycemia: Secondary | ICD-10-CM

## 2018-05-11 DIAGNOSIS — N179 Acute kidney failure, unspecified: Secondary | ICD-10-CM | POA: Diagnosis not present

## 2018-05-11 MED ORDER — AMBULATORY NON FORMULARY MEDICATION: 0 refills | Status: DC

## 2018-05-11 MED ORDER — INSULIN GLARGINE 300 UNIT/ML ~~LOC~~ SOPN: 40.0000 [IU] | PEN_INJECTOR | Freq: Two times a day (BID) | SUBCUTANEOUS | 3 refills | Status: DC | PRN

## 2018-05-11 NOTE — Progress Notes (Signed)
Matthew Coffey is a 84 y.o. male who presents to [facility] today for follow-up diabetes and weight loss.  Matthew Coffey was seen week ago for weight loss associated with excessive urination.  About 2 weeks ago he received left shoulder steroid injection.  At the visit last week he was found to be hyperglycemic with a blood sugar around 500 and had a 12 pound weight loss.  His metabolic panel did show slight increase in his baseline creatinine but was otherwise not severely change aside from hyperglycemia.  His insulin was titrated and he was advised to consume more fluids.  On recheck today he is feeling a lot better.  He notes that he has been increasing his insulin and currently using 35 units of glargine insulin twice daily.  He has trouble reading his meter but when his son checked it he notes that it was 260 yesterday.  No chest pain palpitations or shortness of breath.   ROS as above:  Exam:  BP 123/77   Pulse 81   Wt 178 lb (80.7 kg)   BMI 27.06 kg/m   Wt Readings from Last 5 Encounters:  05/11/18 178 lb (80.7 kg)  05/04/18 178 lb (80.7 kg)  04/26/18 190 lb (86.2 kg)  03/29/18 190 lb (86.2 kg)  12/08/17 191 lb 1.9 oz (86.7 kg)    Gen: Well NAD HEENT: EOMI,  MMM Lungs: Normal work of breathing. CTABL Heart: RRR no MRG Abd: NABS, Soft. Nondistended, Nontender Exts: Brisk capillary refill, warm and well perfused.  Normal skin turgor.  Lab and Radiology Results   Chemistry      Component Value Date/Time   NA 135 05/04/2018 1507   NA 141 05/03/2014   K 5.2 05/04/2018 1507   CL 100 05/04/2018 1507   CO2 25 05/04/2018 1507   BUN 42 (H) 05/04/2018 1507   BUN 21 05/03/2014   CREATININE 1.97 (H) 05/04/2018 1507   GLU 130 05/03/2014      Component Value Date/Time   CALCIUM 9.1 05/04/2018 1507   CALCIUM 9.7 05/03/2014   ALKPHOS 44 07/08/2016 1500   AST 17  05/04/2018 1507   ALT 35 05/04/2018 1507   BILITOT 1.5 (H) 05/04/2018 1507        Assessment and Plan: 84 y.o. male with symptomatically improved dehydration due to polyuria due to uncontrolled diabetes.  This is partially due to a history of poorly controlled diabetes but also partially due to steroid injection.  As the steroids wean off I expect his blood sugar will improve back to his baseline.  Plan to continue to titrate his basal insulin.  Plan to increase from 35 units twice daily to 40 units twice daily.  Plan to continue frequent blood sugar checking.  I written a prescription for a new glucometer that will either have a larger print or audibly call out the blood sugars because he cannot a conventional meter.  Additionally we will follow-up his recent increase in his creatinine.  Plan to recheck renal function panel.  Recheck in 1 week with PCP.  Recheck sooner if needed.   Orders Placed This Encounter  Procedures  . Renal Function Panel   Meds ordered this encounter  Medications  . DISCONTD: Insulin Glargine (TOUJEO MAX SOLOSTAR) 300 UNIT/ML SOPN    Sig: Inject 40 Units into the skin 2 (two) times daily as needed.    Dispense:  15 mL    Refill:  3  .  AMBULATORY NON FORMULARY MEDICATION    Sig: Single glucometer with lancets, test strips for testing 3x daily.  Use large print meter or a talking meter.  Disp 1 meter. Disp strips and lancets 90 day supply for 3x daily testing. E11.65    Dispense:  1 each    Refill:  0  . Insulin Glargine (TOUJEO MAX SOLOSTAR) 300 UNIT/ML SOPN    Sig: Inject 40 Units into the skin 2 (two) times daily as needed.    Dispense:  24 mL    Refill:  3    Ok to increase to nearest box size     Historical information moved to improve visibility of documentation.  Past Medical History:  Diagnosis Date  . Diabetes mellitus without complication (HCC)   . Hyperlipidemia   . Hypertension    Past Surgical History:  Procedure Laterality Date  .  COLECTOMY  07/1999  . kidney removed Right    Social History   Tobacco Use  . Smoking status: Former Smoker    Last attempt to quit: 10/12/1973    Years since quitting: 44.6  . Smokeless tobacco: Never Used  Substance Use Topics  . Alcohol use: No   family history includes Heart attack in his mother.  Medications: Current Outpatient Medications  Medication Sig Dispense Refill  . atorvastatin (LIPITOR) 80 MG tablet Take 1 tablet (80 mg total) by mouth daily. 90 tablet 3  . ELIQUIS 5 MG TABS tablet TAKE 1 TABLET BY MOUTH TWICE DAILY 180 tablet 1  . Insulin Glargine (TOUJEO MAX SOLOSTAR) 300 UNIT/ML SOPN Inject 40 Units into the skin 2 (two) times daily as needed. 24 mL 3  . Insulin Pen Needle 31G X 5 MM MISC Use fresh needle to inject insulin subcutaneously daily. Dx: Insulin Dependent Type 2 Diabetes E11.9, Z79.4 100 each 99  . IRON PO Take by mouth.    . isosorbide mononitrate (IMDUR) 30 MG 24 hr tablet Take 2 tablets (60 mg total) by mouth daily. 90 tablet 3  . metoprolol succinate (TOPROL-XL) 25 MG 24 hr tablet Take 1 tablet (25 mg total) by mouth daily. 90 tablet 3  . nitroGLYCERIN (NITROSTAT) 0.4 MG SL tablet Place 1 tablet (0.4 mg total) under the tongue as needed for chest pain. 15 tablet 5  . ranolazine (RANEXA) 500 MG 12 hr tablet Take by mouth.    . Vitamin D, Ergocalciferol, (DRISDOL) 50000 units CAPS capsule TAKE ONE CAPSULE BY MOUTH ONCE EVERY 7 DAYS 12 capsule 3  . AMBULATORY NON FORMULARY MEDICATION Single glucometer with lancets, test strips for testing 3x daily.  Use large print meter or a talking meter.  Disp 1 meter. Disp strips and lancets 90 day supply for 3x daily testing. E11.65 1 each 0   No current facility-administered medications for this visit.    No Known Allergies   Discussed warning signs or symptoms. Please see discharge instructions. Patient expresses understanding.

## 2018-05-11 NOTE — Patient Instructions (Addendum)
Thank you for coming in today.  Get labs today.   Increase insulin to 40 units twice daily.   Get a meter and test 3x daily.   Recheck in 1 week with Dr Lyn HollingsheadAlexander.

## 2018-05-12 LAB — RENAL FUNCTION PANEL
ALBUMIN MSPROF: 3.4 g/dL — AB (ref 3.6–5.1)
BUN / CREAT RATIO: 17 (calc) (ref 6–22)
BUN: 29 mg/dL — ABNORMAL HIGH (ref 7–25)
CALCIUM: 8.9 mg/dL (ref 8.6–10.3)
CO2: 28 mmol/L (ref 20–32)
Chloride: 99 mmol/L (ref 98–110)
Creat: 1.73 mg/dL — ABNORMAL HIGH (ref 0.70–1.11)
GLUCOSE: 565 mg/dL — AB (ref 65–99)
PHOSPHORUS: 4.3 mg/dL (ref 2.1–4.3)
Potassium: 5.1 mmol/L (ref 3.5–5.3)
Sodium: 135 mmol/L (ref 135–146)

## 2018-05-18 ENCOUNTER — Encounter: Payer: Self-pay | Admitting: Osteopathic Medicine

## 2018-05-18 ENCOUNTER — Ambulatory Visit (INDEPENDENT_AMBULATORY_CARE_PROVIDER_SITE_OTHER): Payer: Medicare Other | Admitting: Osteopathic Medicine

## 2018-05-18 VITALS — BP 110/65 | HR 89 | Temp 98.1°F | Wt 181.7 lb

## 2018-05-18 DIAGNOSIS — R634 Abnormal weight loss: Secondary | ICD-10-CM

## 2018-05-18 DIAGNOSIS — E1165 Type 2 diabetes mellitus with hyperglycemia: Secondary | ICD-10-CM | POA: Diagnosis not present

## 2018-05-18 DIAGNOSIS — R739 Hyperglycemia, unspecified: Secondary | ICD-10-CM

## 2018-05-18 MED ORDER — AMBULATORY NON FORMULARY MEDICATION: 0 refills | Status: DC

## 2018-05-18 NOTE — Progress Notes (Signed)
HPI: Matthew PalmerClaude Coffey is a 84 y.o. male who  has a past medical history of Diabetes mellitus without complication (HCC), Hyperlipidemia, and Hypertension.  [facility]  today, 05/18/18,  for chief complaint of:  Follow-up on weight/sugars  Has been seeing Dr. Denyse Amassorey after hyperglycemia and weight loss.  Patient has not really been checking his sugars at home at this time.  He has gained a little bit of weight back, he states his energy level is pretty good.  Urination is pretty much back to normal.  He states he does not have time to get blood work done today.      Past medical history, surgical history, and family history reviewed.  Current medication list and allergy/intolerance information reviewed.   (See remainder of HPI, ROS, Phys Exam below)  No results found.  No results found for this or any previous visit (from the past 72 hour(s)).   BP 110/65 (BP Location: Left Arm, Patient Position: Sitting, Cuff Size: Normal)   Pulse 89   Temp 98.1 F (36.7 C) (Oral)   Wt 181 lb 11.2 oz (82.4 kg)   BMI 27.63 kg/m      ASSESSMENT/PLAN: Lab orders placed, he can go ahead and get everything rechecked tomorrow.  Hyperglycemia  Uncontrolled type 2 diabetes mellitus with hyperglycemia (HCC)   Meds ordered this encounter  Medications  . AMBULATORY NON FORMULARY MEDICATION    Sig: Single glucometer with lancets, test strips for testing 3x daily.  Use large print meter or a talking meter.  Disp 1 meter. Disp strips and lancets 90 day supply for 3x daily testing. E11.65    Dispense:  1 each    Refill:  0      Follow-up plan: Return in about 3 months (around 08/18/2018).          ############################################ ############################################ ############################################ ############################################    Outpatient Encounter Medications as of 05/18/2018  Medication  Sig  . AMBULATORY NON FORMULARY MEDICATION Single glucometer with lancets, test strips for testing 3x daily.  Use large print meter or a talking meter.  Disp 1 meter. Disp strips and lancets 90 day supply for 3x daily testing. E11.65  . atorvastatin (LIPITOR) 80 MG tablet Take 1 tablet (80 mg total) by mouth daily.  Marland Kitchen. ELIQUIS 5 MG TABS tablet TAKE 1 TABLET BY MOUTH TWICE DAILY  . Insulin Glargine (TOUJEO MAX SOLOSTAR) 300 UNIT/ML SOPN Inject 40 Units into the skin 2 (two) times daily as needed.  . Insulin Pen Needle 31G X 5 MM MISC Use fresh needle to inject insulin subcutaneously daily. Dx: Insulin Dependent Type 2 Diabetes E11.9, Z79.4  . IRON PO Take by mouth.  . isosorbide mononitrate (IMDUR) 30 MG 24 hr tablet Take 2 tablets (60 mg total) by mouth daily.  . metoprolol succinate (TOPROL-XL) 25 MG 24 hr tablet Take 1 tablet (25 mg total) by mouth daily.  . nitroGLYCERIN (NITROSTAT) 0.4 MG SL tablet Place 1 tablet (0.4 mg total) under the tongue as needed for chest pain.  . ranolazine (RANEXA) 500 MG 12 hr tablet Take by mouth.  . Vitamin D, Ergocalciferol, (DRISDOL) 50000 units CAPS capsule TAKE ONE CAPSULE BY MOUTH ONCE EVERY 7 DAYS   No facility-administered encounter medications on file as of 05/18/2018.    No Known Allergies    Review of Systems:  Constitutional: No recent illness  HEENT: No  headache, no vision change  Cardiac: No  chest pain, No  pressure  Respiratory:  No  shortness of breath. No  Cough  Gastrointestinal: No  abdominal pain  Musculoskeletal: No new myalgia/arthralgia  Skin: No  Rash  Neurologic: No  weakness, No  Dizziness   Exam:  BP (!) 113/45   Pulse 84   Temp 98.1 F (36.7 C) (Oral)   Wt 181 lb 11.2 oz (82.4 kg)   BMI 27.63 kg/m   Constitutional: VS see above. General Appearance: alert, well-developed, well-nourished, NAD  Respiratory: Normal respiratory effort. no wheeze, no rhonchi, no rales  Cardiovascular: S1/S2 normal, RRR.    Musculoskeletal: Gait normal. Symmetric and independent movement of all extremities  Neurological: Normal balance/coordination. No tremor.  Skin: warm, dry, intact.   Psychiatric: Normal judgment/insight. Normal mood and affect. Oriented x3.   Visit summary with medication list and pertinent instructions was printed for patient to review, advised to alert [place] if any changes needed. All questions at time of visit were answered - patient instructed to contact office with any additional concerns. ER/RTC precautions were reviewed with the patient and understanding verbalized.   Follow-up plan: Return in about 6 weeks (around 06/29/2018) for Recheck A1c, sooner if needed.  Note: Total time spent 15 minutes, greater than 50% of the visit was spent face-to-face counseling and coordinating care for the following: The primary encounter diagnosis was Hyperglycemia. Diagnoses of Uncontrolled type 2 diabetes mellitus with hyperglycemia (HCC) and Weight loss were also pertinent to this visit.Marland Kitchen  Please note: voice recognition software was used to produce this document, and typos may escape review. Please contact Dr. Lyn Hollingshead for any needed clarifications.

## 2018-05-19 ENCOUNTER — Encounter: Payer: Self-pay | Admitting: Osteopathic Medicine

## 2018-05-30 ENCOUNTER — Telehealth: Payer: Self-pay | Admitting: Osteopathic Medicine

## 2018-05-30 NOTE — Telephone Encounter (Signed)
Mr.Stanback called this afternoon. He stated that when he was here last week,Dr.Alexander didin't get to finish his visit. The only thing that he would say was could someone give him a call about this. He is unsure if he is supposed to  come back to finish the visit.

## 2018-05-31 NOTE — Telephone Encounter (Signed)
Routing to PCP for review tomorrow, unsure why he feels she was unable to finish his visit.

## 2018-06-01 NOTE — Telephone Encounter (Signed)
He may referring to getting labs?  He stated he didn't have time to do labs that day.  He can just.... Go directly to lab?

## 2018-06-01 NOTE — Telephone Encounter (Signed)
Patient has a follow up with Dr. Lyn HollingsheadAlexander on 06/02/2018. It was approved by PCP.

## 2018-06-01 NOTE — Telephone Encounter (Signed)
Left message for patient to call back  

## 2018-06-02 ENCOUNTER — Ambulatory Visit (INDEPENDENT_AMBULATORY_CARE_PROVIDER_SITE_OTHER): Payer: Medicare Other | Admitting: Osteopathic Medicine

## 2018-06-02 ENCOUNTER — Encounter: Payer: Self-pay | Admitting: Osteopathic Medicine

## 2018-06-02 VITALS — BP 113/71 | HR 86 | Temp 98.5°F | Wt 176.3 lb

## 2018-06-02 DIAGNOSIS — I2510 Atherosclerotic heart disease of native coronary artery without angina pectoris: Secondary | ICD-10-CM | POA: Diagnosis not present

## 2018-06-02 DIAGNOSIS — R0602 Shortness of breath: Secondary | ICD-10-CM

## 2018-06-02 DIAGNOSIS — R5382 Chronic fatigue, unspecified: Secondary | ICD-10-CM | POA: Diagnosis not present

## 2018-06-02 DIAGNOSIS — R634 Abnormal weight loss: Secondary | ICD-10-CM

## 2018-06-02 DIAGNOSIS — E1165 Type 2 diabetes mellitus with hyperglycemia: Secondary | ICD-10-CM

## 2018-06-02 LAB — D-DIMER, QUANTITATIVE: D-Dimer, Quant: 0.32 mcg/mL FEU (ref <0.50)

## 2018-06-02 MED ORDER — AMBULATORY NON FORMULARY MEDICATION: 0 refills | Status: AC

## 2018-06-02 NOTE — Progress Notes (Signed)
HPI: Matthew Coffey is a 84 y.o. male who  has a past medical history of Diabetes mellitus without complication (HCC), Hyperlipidemia, and Hypertension.  he presents to [facility] today, 06/02/18,  for chief complaint of:  SOB, fatigue  Last visit 05/18/18 he was here to follow up for weight loss, hyperglycemia (possible complications from steroid injection, also c/o increased urination then as well which has resolved). He was feeling pretty good at that visit. He did not have time that day to do labs and was irate I was running a bit behind. Few days ago 05/30/18 called concerned that he was rushed out of that visit while still having more things to discuss. I asked him to be scheduled for a visit.   Today has vague complaints of longstanding fatigue, "just feeling sick" but no abdominal pain, feels better with eating, feels leg cramps on occasion, no chest pain, first says no trouble breathing then reports getting easily winded with activity similar to when he had the pulmonary embolus awhile ago. Dr Denyse Amass has been treating for shoulder pain as well, pt is concerned this pain may be due to heart or arthritis. He denies chest pain, either at rest or on exertion. No LE swelling.     Wt Readings from Last 4 Encounters:  06/02/18 176 lb 4.8 oz (80 kg)  05/18/18 181 lb 11.2 oz (82.4 kg)  05/11/18 178 lb (80.7 kg)  06/02/17          203 lb   Last saw cardiology 03/31/18: [name]. 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 Echo same day showed normal LV fxn.     Past medical history, surgical history, and family history reviewed.  Current medication list and allergy/intolerance information reviewed.   (See remainder of HPI, ROS, Phys Exam below)  BP 113/71 (BP Location: Left Arm, Patient Position: Sitting, Cuff Size: Normal)   Pulse 86   Temp 98.5 F (36.9 C) (Oral)   Wt 176 lb 4.8 oz (80 kg)   BMI 26.81 kg/m    EKG  interpretation: Rate: 81  Rhythm: sinus No ST/T changes concerning for acute ischemia/infarct  Old changes c/e CAD/prior MI Previous EKG about the same    ASSESSMENT/PLAN: The primary encounter diagnosis was Chronic fatigue. Diagnoses of SOB (shortness of breath) on exertion, Coronary artery disease involving native coronary artery of native heart without angina pectoris, Uncontrolled type 2 diabetes mellitus with hyperglycemia (HCC), and Weight loss were also pertinent to this visit.   PE seems unlikely but he's on lower dose Eliquis so possibly. Will see if we can get an negative D-dimer or will get CT chest, may consider CT Abd/Pelv as well w/ weight loss issue  Apologies made that he felt unheard at last visit - sorry about the miscommunication but I was unaware he had any concerns other than what was discussed. Advised in the fuutre if he is having troubling symptoms or any questions he needs to make me aware of these!   Orders Placed This Encounter  Procedures  . Urine Culture  . COMPLETE METABOLIC PANEL WITH GFR  . TSH  . Magnesium  . Microalbumin / creatinine urine ratio  . Urinalysis, Routine w reflex microscopic  . T4, free  . CBC with Differential/Platelet  . Phosphorus  . B Nat Peptide  . D-dimer, quantitative (not at [facility])  . EKG 12-Lead      Meds ordered this encounter  Medications  . AMBULATORY NON  FORMULARY MEDICATION    Sig: Single glucometer with lancets, test strips for testing 3x daily.  Use large print meter or a talking meter.  Disp 1 meter. Disp strips and lancets 90 day supply for 3x daily testing. E11.65    Dispense:  1 each    Refill:  0    Patient Instructions  Will get blood work today to try to figure out fatigue, breathing problems, weight loss.   If your labs are positive for something called D-Dimer, we will need to do a CT scan of the chest to evaluate for blood clot. We may also want to consider a CT of the abdomen, too, to look for  reasons for weight loss.   Let's have you back to the office to check your lungs, given your smoking history.  If all tests are normal, we will call Dr Leeann Mustenaldo (heart doctor) and see if he can check you out.    Follow-up plan: Return for recheck depending on results .                                   ############################################ ############################################ ############################################ ############################################    Outpatient Encounter Medications as of 06/02/2018  Medication Sig  . AMBULATORY NON FORMULARY MEDICATION Single glucometer with lancets, test strips for testing 3x daily.  Use large print meter or a talking meter.  Disp 1 meter. Disp strips and lancets 90 day supply for 3x daily testing. E11.65  . atorvastatin (LIPITOR) 80 MG tablet Take 1 tablet (80 mg total) by mouth daily.  Marland Kitchen. ELIQUIS 5 MG TABS tablet TAKE 1 TABLET BY MOUTH TWICE DAILY  . Insulin Glargine (TOUJEO MAX SOLOSTAR) 300 UNIT/ML SOPN Inject 40 Units into the skin 2 (two) times daily as needed.  . Insulin Pen Needle 31G X 5 MM MISC Use fresh needle to inject insulin subcutaneously daily. Dx: Insulin Dependent Type 2 Diabetes E11.9, Z79.4  . IRON PO Take by mouth.  . isosorbide mononitrate (IMDUR) 30 MG 24 hr tablet Take 2 tablets (60 mg total) by mouth daily.  . metoprolol succinate (TOPROL-XL) 25 MG 24 hr tablet Take 1 tablet (25 mg total) by mouth daily.  . nitroGLYCERIN (NITROSTAT) 0.4 MG SL tablet Place 1 tablet (0.4 mg total) under the tongue as needed for chest pain.  . ranolazine (RANEXA) 500 MG 12 hr tablet Take by mouth.  . Vitamin D, Ergocalciferol, (DRISDOL) 50000 units CAPS capsule TAKE ONE CAPSULE BY MOUTH ONCE EVERY 7 DAYS  . [DISCONTINUED] AMBULATORY NON FORMULARY MEDICATION Single glucometer with lancets, test strips for testing 3x daily.  Use large print meter or a talking meter.  Disp 1 meter. Disp  strips and lancets 90 day supply for 3x daily testing. E11.65   No facility-administered encounter medications on file as of 06/02/2018.    No Known Allergies    Review of Systems:  Constitutional: No recent illness, no fever/chills, +fatigue and weight loss though weight has stabilized a bit  HEENT: No  headache, no vision change  Cardiac: No  chest pain, No  pressure, No palpitations  Respiratory:  +shortness of breath. No  Cough  Gastrointestinal: No  abdominal pain, no change on bowel habits  Musculoskeletal: No new myalgia/arthralgia  Skin: No  Rash  Hem/Onc: No  easy bruising/bleeding, No  abnormal lumps/bumps  Neurologic: No  weakness, No  Dizziness  Psychiatric: No  concerns with depression, No  concerns with  anxiety  Exam:  BP 113/71 (BP Location: Left Arm, Patient Position: Sitting, Cuff Size: Normal)   Pulse 86   Temp 98.5 F (36.9 C) (Oral)   Wt 176 lb 4.8 oz (80 kg)   BMI 26.81 kg/m   Constitutional: VS see above. General Appearance: alert, well-developed, well-nourished, NAD  Eyes: Normal lids and conjunctive, non-icteric sclera  Ears, Nose, Mouth, Throat: MMM, Normal external inspection ears/nares/mouth/lips/gums.  Neck: No masses, trachea midline.   Respiratory: Normal respiratory effort. no wheeze, no rhonchi, no rales  Cardiovascular: S1/S2 normal, no murmur, no rub/gallop auscultated. RRR.   Abd: nontender. Nondistended.   Musculoskeletal: Gait normal. Symmetric and independent movement of all extremities  Neurological: Normal balance/coordination. No tremor.  Skin: warm, dry, intact.   Psychiatric: Fair judgment/insight. Normal mood and affect.    Visit summary with medication list and pertinent instructions was printed for patient to review, advised to alert [place] if any changes needed. All questions at time of visit were answered - patient instructed to contact office with any additional concerns. ER/RTC precautions were reviewed with  the patient and understanding verbalized.   Follow-up plan: Return for recheck depending on results .  Note: Total time spent 25 minutes, greater than 50% of the visit was spent face-to-face counseling and coordinating care for the following: The primary encounter diagnosis was Chronic fatigue. Diagnoses of SOB (shortness of breath) on exertion, Coronary artery disease involving native coronary artery of native heart without angina pectoris, Uncontrolled type 2 diabetes mellitus with hyperglycemia (HCC), and Weight loss were also pertinent to this visit.Marland Kitchen  Please note: voice recognition software was used to produce this document, and typos may escape review. Please contact Dr. Lyn Hollingshead for any needed clarifications.

## 2018-06-02 NOTE — Patient Instructions (Signed)
Will get blood work today to try to figure out fatigue, breathing problems, weight loss.   If your labs are positive for something called D-Dimer, we will need to do a CT scan of the chest to evaluate for blood clot. We may also want to consider a CT of the abdomen, too, to look for reasons for weight loss.   Let's have you back to the office to check your lungs, given your smoking history.  If all tests are normal, we will call Dr Leeann Mustenaldo (heart doctor) and see if he can check you out.

## 2018-06-03 LAB — COMPLETE METABOLIC PANEL WITH GFR
AG RATIO: 1.3 (calc) (ref 1.0–2.5)
ALT: 22 U/L (ref 9–46)
AST: 15 U/L (ref 10–35)
Albumin: 3.2 g/dL — ABNORMAL LOW (ref 3.6–5.1)
Alkaline phosphatase (APISO): 59 U/L (ref 40–115)
BUN/Creatinine Ratio: 14 (calc) (ref 6–22)
BUN: 21 mg/dL (ref 7–25)
CALCIUM: 9.5 mg/dL (ref 8.6–10.3)
CO2: 26 mmol/L (ref 20–32)
Chloride: 102 mmol/L (ref 98–110)
Creat: 1.54 mg/dL — ABNORMAL HIGH (ref 0.70–1.11)
GFR, EST AFRICAN AMERICAN: 47 mL/min/{1.73_m2} — AB (ref >=60)
GFR, EST NON AFRICAN AMERICAN: 40 mL/min/{1.73_m2} — AB (ref >=60)
Globulin: 2.4 g/dL (calc) (ref 1.9–3.7)
Glucose, Bld: 226 mg/dL — ABNORMAL HIGH (ref 65–99)
POTASSIUM: 4.3 mmol/L (ref 3.5–5.3)
Sodium: 139 mmol/L (ref 135–146)
TOTAL PROTEIN: 5.6 g/dL — AB (ref 6.1–8.1)
Total Bilirubin: 1.6 mg/dL — ABNORMAL HIGH (ref 0.2–1.2)

## 2018-06-03 LAB — CBC WITH DIFFERENTIAL/PLATELET
BASOS ABS: 41 {cells}/uL (ref 0–200)
Basophils Relative: 0.6 %
EOS ABS: 320 {cells}/uL (ref 15–500)
EOS PCT: 4.7 %
HCT: 39.2 % (ref 38.5–50.0)
HEMOGLOBIN: 13.4 g/dL (ref 13.2–17.1)
Lymphs Abs: 1095 cells/uL (ref 850–3900)
MCH: 31.5 pg (ref 27.0–33.0)
MCHC: 34.2 g/dL (ref 32.0–36.0)
MCV: 92.2 fL (ref 80.0–100.0)
MONOS PCT: 10.4 %
MPV: 11.5 fL (ref 7.5–12.5)
NEUTROS ABS: 4638 {cells}/uL (ref 1500–7800)
NEUTROS PCT: 68.2 %
Platelets: 200 10*3/uL (ref 140–400)
RBC: 4.25 10*6/uL (ref 4.20–5.80)
RDW: 13.4 % (ref 11.0–15.0)
TOTAL LYMPHOCYTE: 16.1 %
WBC mixed population: 707 cells/uL (ref 200–950)
WBC: 6.8 10*3/uL (ref 3.8–10.8)

## 2018-06-03 LAB — URINE CULTURE
MICRO NUMBER: 91005302
RESULT: NO GROWTH
SPECIMEN QUALITY: ADEQUATE

## 2018-06-03 LAB — URINALYSIS, ROUTINE W REFLEX MICROSCOPIC
BILIRUBIN URINE: NEGATIVE
Hgb urine dipstick: NEGATIVE
Ketones, ur: NEGATIVE
Leukocytes, UA: NEGATIVE
Nitrite: NEGATIVE
Protein, ur: NEGATIVE
Specific Gravity, Urine: 1.025 (ref 1.001–1.03)

## 2018-06-03 LAB — MICROALBUMIN / CREATININE URINE RATIO
Creatinine, Urine: 170 mg/dL (ref 20–320)
Microalb Creat Ratio: 7 mcg/mg creat (ref <30)
Microalb, Ur: 1.2 mg/dL

## 2018-06-03 LAB — MAGNESIUM: Magnesium: 1.7 mg/dL (ref 1.5–2.5)

## 2018-06-03 LAB — PHOSPHORUS: Phosphorus: 4.4 mg/dL — ABNORMAL HIGH (ref 2.1–4.3)

## 2018-06-03 LAB — TSH: TSH: 0.9 mIU/L (ref 0.40–4.50)

## 2018-06-03 LAB — T4, FREE: Free T4: 1.2 ng/dL (ref 0.8–1.8)

## 2018-06-03 LAB — BRAIN NATRIURETIC PEPTIDE: Brain Natriuretic Peptide: 111 pg/mL — ABNORMAL HIGH (ref <100)

## 2018-06-15 ENCOUNTER — Other Ambulatory Visit: Payer: Self-pay | Admitting: Osteopathic Medicine

## 2018-06-15 DIAGNOSIS — I10 Essential (primary) hypertension: Secondary | ICD-10-CM

## 2018-06-17 ENCOUNTER — Other Ambulatory Visit: Payer: Self-pay | Admitting: Osteopathic Medicine

## 2018-06-17 DIAGNOSIS — I10 Essential (primary) hypertension: Secondary | ICD-10-CM

## 2018-06-17 NOTE — Telephone Encounter (Signed)
[facility] pharmacy requesting med RF for isosorbide mononitrate. It appears pt is taking 2 tabs daily. Pls advise, thanks.

## 2018-06-20 ENCOUNTER — Ambulatory Visit (INDEPENDENT_AMBULATORY_CARE_PROVIDER_SITE_OTHER): Payer: Medicare Other | Admitting: Osteopathic Medicine

## 2018-06-20 VITALS — BP 113/59 | HR 85 | Ht 68.0 in | Wt 179.0 lb

## 2018-06-20 DIAGNOSIS — R5382 Chronic fatigue, unspecified: Secondary | ICD-10-CM

## 2018-06-20 DIAGNOSIS — I2510 Atherosclerotic heart disease of native coronary artery without angina pectoris: Secondary | ICD-10-CM | POA: Diagnosis not present

## 2018-06-20 DIAGNOSIS — E1165 Type 2 diabetes mellitus with hyperglycemia: Secondary | ICD-10-CM

## 2018-06-20 DIAGNOSIS — R0602 Shortness of breath: Secondary | ICD-10-CM | POA: Diagnosis not present

## 2018-06-20 MED ORDER — ALBUTEROL SULFATE (2.5 MG/3ML) 0.083% IN NEBU
2.5000 mg | INHALATION_SOLUTION | Freq: Once | RESPIRATORY_TRACT | Status: AC
  Administered 2018-06-20: 2.5 mg via RESPIRATORY_TRACT

## 2018-06-20 NOTE — Progress Notes (Signed)
HPI: Matthew Coffey is a 84 y.o. male who  has a past medical history of Diabetes mellitus without complication (HCC), Hyperlipidemia, and Hypertension.  he presents to [facility] today, 06/20/18,  for chief complaint of:  SOB/spirometry  Spirometry inconclusive, patient breathing maneuvers not adequate.  He still reporting significant shortness of breath on exertion, no chest pain or dizziness.  He has an upcoming appointment with cardiology, I have asked him to call his cardiologist to alert them to the symptom to see if they may need to see him sooner.    Past medical history, surgical history, and family history reviewed.  Current medication list and allergy/intolerance information reviewed.   (See remainder of HPI, ROS, Phys Exam below)    ASSESSMENT/PLAN:   SOB (shortness of breath) on exertion - Plan: albuterol (PROVENTIL) (2.5 MG/3ML) 0.083% nebulizer solution 2.5 mg, PR EVAL OF BRONCHOSPASM  Chronic fatigue  Coronary artery disease involving native coronary artery of native heart without angina pectoris  Uncontrolled type 2 diabetes mellitus with hyperglycemia (HCC)   Meds ordered this encounter  Medications  . albuterol (PROVENTIL) (2.5 MG/3ML) 0.083% nebulizer solution 2.5 mg    Patient Instructions  Check up on your appointment with [name] (heart) - [phone]     Follow-up plan: Return for recheck with [name] as scheduled for diabetes follow - up .        ############################################ ############################################ ############################################ ############################################    Outpatient Encounter Medications as of 06/20/2018  Medication Sig  . AMBULATORY NON FORMULARY MEDICATION Single glucometer with lancets, test strips for testing 3x daily.  Use large print meter or a talking meter.  Disp 1 meter. Disp strips and lancets 90 day supply for 3x  daily testing. E11.65  . ELIQUIS 5 MG TABS tablet TAKE 1 TABLET BY MOUTH TWICE DAILY  . Insulin Glargine (TOUJEO MAX SOLOSTAR) 300 UNIT/ML SOPN Inject 40 Units into the skin 2 (two) times daily as needed.  . Insulin Pen Needle 31G X 5 MM MISC Use fresh needle to inject insulin subcutaneously daily. Dx: Insulin Dependent Type 2 Diabetes E11.9, Z79.4  . IRON PO Take by mouth.  . isosorbide mononitrate (IMDUR) 30 MG 24 hr tablet Take 2 tablets (60 mg total) by mouth daily.  . metoprolol succinate (TOPROL-XL) 25 MG 24 hr tablet Take 1 tablet (25 mg total) by mouth daily.  . nitroGLYCERIN (NITROSTAT) 0.4 MG SL tablet Place 1 tablet (0.4 mg total) under the tongue as needed for chest pain.  . ranolazine (RANEXA) 500 MG 12 hr tablet Take by mouth.  . Vitamin D, Ergocalciferol, (DRISDOL) 50000 units CAPS capsule TAKE ONE CAPSULE BY MOUTH ONCE EVERY 7 DAYS  . atorvastatin (LIPITOR) 80 MG tablet Take 1 tablet (80 mg total) by mouth daily.  . [EXPIRED] albuterol (PROVENTIL) (2.5 MG/3ML) 0.083% nebulizer solution 2.5 mg    No facility-administered encounter medications on file as of 06/20/2018.    No Known Allergies    Review of Systems:  Constitutional: No recent illness, +fatigue  Cardiac: No  chest pain, No  pressure, No palpitations  Respiratory:  +shortness of breath. No  Cough  Gastrointestinal: No  abdominal pain, no change on bowel habits  Musculoskeletal: No new myalgia/arthralgia  Neurologic: No focal weakness, No  Dizziness   Exam:  BP (!) 113/59   Pulse 85   Ht 5\' 8"  (1.727 m)   Wt 179 lb (81.2 kg)   SpO2 98%   BMI 27.22 kg/m  Constitutional: VS see above. General Appearance: alert, well-developed, well-nourished, NAD  Eyes: Normal lids and conjunctive, non-icteric sclera  Ears, Nose, Mouth, Throat: MMM, Normal external inspection ears/nares/mouth/lips/gums.  Neck: No masses, trachea midline.   Respiratory: Normal respiratory effort. no wheeze, no rhonchi, no  rales  Cardiovascular: S1/S2 normal, no murmur, no rub/gallop auscultated. RRR. No LE edema.   Musculoskeletal: Gait normal. Symmetric and independent movement of all extremities  Neurological: Normal balance/coordination. No tremor.  Skin: warm, dry, intact.   Psychiatric: Normal judgment/insight. Normal mood and affect. Oriented x3.   Visit summary with medication list and pertinent instructions was printed for patient to review, advised to alert [place] if any changes needed. All questions at time of visit were answered - patient instructed to contact office with any additional concerns. ER/RTC precautions were reviewed with the patient and understanding verbalized.   Follow-up plan: Return for recheck with [name] as scheduled for diabetes follow - up .   Please note: voice recognition software was used to produce this document, and typos may escape review. Please contact Dr. Lyn Hollingshead for any needed clarifications.

## 2018-06-20 NOTE — Patient Instructions (Signed)
Check up on your appointment with [name] (heart) - [phone]

## 2018-06-21 ENCOUNTER — Encounter: Payer: Self-pay | Admitting: Osteopathic Medicine

## 2018-06-21 NOTE — Telephone Encounter (Signed)
Pt has been updated.  

## 2018-07-01 ENCOUNTER — Other Ambulatory Visit: Payer: Self-pay | Admitting: Osteopathic Medicine

## 2018-07-01 DIAGNOSIS — E559 Vitamin D deficiency, unspecified: Secondary | ICD-10-CM

## 2018-07-15 ENCOUNTER — Other Ambulatory Visit: Payer: Self-pay | Admitting: Osteopathic Medicine

## 2018-07-15 NOTE — Telephone Encounter (Signed)
Refill request for atorvastatin 80 mg  Pt seen at [facility] by Dr. Lyn Hollingshead. Please review.  Medication expired on 06/02/18.

## 2018-07-22 ENCOUNTER — Other Ambulatory Visit: Payer: Self-pay | Admitting: Osteopathic Medicine

## 2018-07-22 DIAGNOSIS — I10 Essential (primary) hypertension: Secondary | ICD-10-CM

## 2018-07-25 NOTE — Telephone Encounter (Signed)
Requested medication (s) are due for refill today: Yes  Requested medication (s) are on the active medication list: Yes  Last refill:  07/26/17  Future visit scheduled: Yes  Notes to clinic:  See request    Requested Prescriptions  Pending Prescriptions Disp Refills   metoprolol succinate (TOPROL-XL) 25 MG 24 hr tablet [Pharmacy Med Name: METOPROLOL ER 25MG   TAB] 90 tablet 3    Sig: TAKE 1 TABLET BY MOUTH ONCE DAILY     There is no refill protocol information for this order

## 2018-08-29 ENCOUNTER — Encounter: Payer: Self-pay | Admitting: Osteopathic Medicine

## 2018-08-29 ENCOUNTER — Ambulatory Visit (INDEPENDENT_AMBULATORY_CARE_PROVIDER_SITE_OTHER): Payer: Medicare Other | Admitting: Osteopathic Medicine

## 2018-08-29 VITALS — BP 113/65 | HR 84 | Temp 98.0°F | Wt 185.7 lb

## 2018-08-29 DIAGNOSIS — Z23 Encounter for immunization: Secondary | ICD-10-CM | POA: Diagnosis not present

## 2018-08-29 DIAGNOSIS — E1165 Type 2 diabetes mellitus with hyperglycemia: Secondary | ICD-10-CM | POA: Diagnosis not present

## 2018-08-29 DIAGNOSIS — Z789 Other specified health status: Secondary | ICD-10-CM | POA: Diagnosis not present

## 2018-08-29 LAB — POCT GLYCOSYLATED HEMOGLOBIN (HGB A1C): HEMOGLOBIN A1C: 8.9 % — AB (ref 4.0–5.6)

## 2018-08-29 MED ORDER — APIXABAN 5 MG PO TABS: 5.0000 mg | ORAL_TABLET | Freq: Two times a day (BID) | ORAL | 1 refills | Status: DC

## 2018-08-29 MED ORDER — RANOLAZINE ER 500 MG PO TB12: 500.0000 mg | ORAL_TABLET | Freq: Two times a day (BID) | ORAL | 1 refills | Status: DC

## 2018-08-29 NOTE — Progress Notes (Signed)
HPI: Matthew Coffey is a 84 y.o. male who  has a past medical history of Diabetes mellitus without complication (HCC), Hyperlipidemia, and Hypertension.  he presents to [facility] today, 08/29/18,  for chief complaint of:  DM2 f/u  SOB f/u  SOB:  Pt reports no SOB now.  Spirometry inconclusive last visit. 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 also follow for aortic stenosis.) Mod/severe AS based on echo few mos ago, BNP alight elevation but normal for age, no chest discomfort w/ hx stable angina. No concern for cardiac decompensation.   Has questions about pneumonia vaccines. He has had both these. I think he might mean Shingrix?   Immunization History  Administered Date(s) Administered  . Influenza, High Dose Seasonal PF 06/02/2017  . Influenza-Unspecified 06/02/2017  . Pneumococcal Conjugate-13 06/02/2017  . Pneumococcal Polysaccharide-23 04/09/2010  . Pneumococcal-Unspecified 04/09/2010  . Tdap 06/02/2017    DM2: Noncompliant with diet - eating lots of sweets, doughnuts. Not checking Glc, no hypoglycemia symptoms, taking 40 units Toujeo daily.   Has questions about "a little pink pill I take twice a day and another littler pink pill I take twice a day" wants to make sure he has refills on these....    At today's visit... Past medical history, surgical history, and family history reviewed and updated as needed.  Current medication list and allergy/intolerance information reviewed and updated as needed. (See remainder of HPI, ROS, Phys Exam below)   No results found.  Results for orders placed or performed in visit on 08/29/18 (from the past 72 hour(s))  POCT HgB A1C     Status: Abnormal   Collection Time: 08/29/18  4:03 PM  Result Value Ref Range   Hemoglobin A1C 8.9 (A) 4.0 - 5.6 %    HbA1c POC (<> result, manual entry)     HbA1c, POC (prediabetic range)     HbA1c, POC (controlled diabetic range)            ASSESSMENT/PLAN: The primary encounter diagnosis was Uncontrolled type 2 diabetes mellitus with hyperglycemia (HCC). Diagnoses of Need for influenza vaccination and Poor historian were also pertinent to this visit.   Orders Placed This Encounter  Procedures  . Flu vaccine HIGH DOSE PF (Fluzone High dose)  . POCT HgB A1C     Meds ordered this encounter  Medications  . apixaban (ELIQUIS) 5 MG TABS tablet    Sig: Take 1 tablet (5 mg total) by mouth 2 (two) times daily.    Dispense:  180 tablet    Refill:  1  . ranolazine (RANEXA) 500 MG 12 hr tablet    Sig: Take 1 tablet (500 mg total) by mouth 2 (two) times daily.    Dispense:  180 tablet    Refill:  1    Patient Instructions   Continue current medicines   CUT BACK ON SWEETS  "Pink pills" I think one is Eliquis and one is Ranexa - please check with your pharmacist or can bring me pill bottles to confirm the medications        Follow-up plan: Return in about 3 months (around 11/29/2018) for recheck A1C, sooner if needed .                                ############################################ ############################################ ############################################ ############################################  No outpatient medications have been marked as taking for the 08/29/18 encounter (Appointment) with Sunnie NielsenAlexander, Camille Thau, DO.    No Known Allergies     Review of Systems:  Constitutional: No recent illness  HEENT: No  headache, no vision change  Cardiac: No  chest pain, No  pressure, No palpitations  Respiratory:  No  shortness of breath. No  Cough  Musculoskeletal: No new myalgia/arthralgia  Neurologic: No  weakness, No  Dizziness  Psychiatric: No  concerns with depression, No  concerns with anxiety  Exam:  BP  113/65 (BP Location: Left Arm, Patient Position: Sitting, Cuff Size: Normal)   Pulse 84   Temp 98 F (36.7 C) (Oral)   Wt 185 lb 11.2 oz (84.2 kg)   BMI 28.24 kg/m   Constitutional: VS see above. General Appearance: alert, well-developed, well-nourished, NAD  Eyes: Normal lids and conjunctive, non-icteric sclera  Ears, Nose, Mouth, Throat: MMM, Normal external inspection ears/nares/mouth/lips/gums.  Neck: No masses, trachea midline.   Respiratory: Normal respiratory effort. no wheeze, no rhonchi, no rales  Cardiovascular: S1/S2 normal, no murmur, no rub/gallop auscultated. RRR.   Musculoskeletal: Gait normal. Symmetric and independent movement of all extremities  Neurological: Normal balance/coordination. No tremor.  Skin: warm, dry, intact.   Psychiatric: Normal judgment/insight. Normal mood and affect. Oriented x3.       Visit summary with medication list and pertinent instructions was printed for patient to review, patient was advised to alert us if any updates are needed. All questions at time of visit were answered - patient instructed to contact office with any additional concerns. ER/RTC precautions were reviewed with the patient and understanding verbalized.   Note: Total time spent 25 minutes, greater than 50% of the visit was spent face-to-face counseling and coordinating care for the following: The primary encounter diagnosis was Uncontrolled type 2 diabetes mellitus with hyperglycemia (HCC). Diagnoses of Need for influenza vaccination and Poor historian were also pertinent to this visit.Marland Kitchen.  Please note: voice recognition software was used to produce this document, and typos may escape review. Please contact Dr. Lyn HollingsheadAlexander for any needed clarifications.    Follow up plan: No follow-ups on file.

## 2018-08-29 NOTE — Patient Instructions (Addendum)
   Continue current medicines   CUT BACK ON SWEETS  "Pink pills" I think one is Eliquis and one is Ranexa - please check with your pharmacist or can bring me pill bottles to confirm the medications

## 2018-11-30 ENCOUNTER — Ambulatory Visit (INDEPENDENT_AMBULATORY_CARE_PROVIDER_SITE_OTHER): Payer: Medicare Other | Admitting: Osteopathic Medicine

## 2018-11-30 ENCOUNTER — Encounter: Payer: Self-pay | Admitting: Osteopathic Medicine

## 2018-11-30 VITALS — BP 127/66 | HR 78 | Temp 98.1°F | Wt 191.8 lb

## 2018-11-30 DIAGNOSIS — I10 Essential (primary) hypertension: Secondary | ICD-10-CM

## 2018-11-30 DIAGNOSIS — E1165 Type 2 diabetes mellitus with hyperglycemia: Secondary | ICD-10-CM

## 2018-11-30 DIAGNOSIS — I2510 Atherosclerotic heart disease of native coronary artery without angina pectoris: Secondary | ICD-10-CM | POA: Diagnosis not present

## 2018-11-30 LAB — POCT GLYCOSYLATED HEMOGLOBIN (HGB A1C): Hemoglobin A1C: 8.4 % — AB (ref 4.0–5.6)

## 2018-11-30 NOTE — Progress Notes (Signed)
HPI: Matthew Coffey is a 85 y.o. male who  has a past medical history of Diabetes mellitus without complication (HCC), Hyperlipidemia, and Hypertension.  he presents to [facility] today, 11/30/18,  for chief complaint of:  Type 2 diabetes follow-up  A1C 08/2018: 8.9 Today 11/2018: 8.4  on Toujeo 40 units bid  Reports he recently tried to get his prescriptions through some kind of mail order pharmacy, his daughter was arranging this.  He really cannot give me any details.  Hypertension: Blood pressure is at goal today, no chest pain, pressure, shortness of breath, headache or dizziness.    At today's visit 11/30/18 ... PMH, PSH, FH reviewed and updated as needed.  Current medication list and allergy/intolerance hx reviewed and updated as needed. (See remainder of HPI, ROS, Phys Exam below)   No results found.  Results for orders placed or performed in visit on 11/30/18 (from the past 72 hour(s))  POCT HgB A1C     Status: Abnormal   Collection Time: 11/30/18  3:22 PM  Result Value Ref Range   Hemoglobin A1C 8.4 (A) 4.0 - 5.6 %   HbA1c POC (<> result, manual entry)     HbA1c, POC (prediabetic range)     HbA1c, POC (controlled diabetic range)            ASSESSMENT/PLAN: The primary encounter diagnosis was Uncontrolled type 2 diabetes mellitus with hyperglycemia (HCC). Diagnoses of Coronary artery disease involving native coronary artery of native heart without angina pectoris and Essential hypertension were also pertinent to this visit.   Orders Placed This Encounter  Procedures  . POCT HgB A1C       Patient Instructions  Can continue current medications Recheck in 3 months, see me sooner if needed!        Follow-up plan: Return in about 3 months (around 02/28/2019) for Diabetes follow-up  .                                                 ################################################# ################################################# ################################################# #################################################    Current Meds  Medication Sig  . AMBULATORY NON FORMULARY MEDICATION Single glucometer with lancets, test strips for testing 3x daily.  Use large print meter or a talking meter.  Disp 1 meter. Disp strips and lancets 90 day supply for 3x daily testing. E11.65  . apixaban (ELIQUIS) 5 MG TABS tablet Take 1 tablet (5 mg total) by mouth 2 (two) times daily.  Marland Kitchen atorvastatin (LIPITOR) 80 MG tablet TAKE 1 TABLET BY MOUTH ONCE DAILY  . Insulin Glargine (TOUJEO MAX SOLOSTAR) 300 UNIT/ML SOPN Inject 40 Units into the skin 2 (two) times daily as needed.  . Insulin Pen Needle 31G X 5 MM MISC Use fresh needle to inject insulin subcutaneously daily. Dx: Insulin Dependent Type 2 Diabetes E11.9, Z79.4  . IRON PO Take by mouth.  . isosorbide mononitrate (IMDUR) 30 MG 24 hr tablet Take 2 tablets (60 mg total) by mouth daily.  . isosorbide mononitrate (IMDUR) 30 MG 24 hr tablet TAKE 1 TABLET BY MOUTH ONCE DAILY  . metoprolol succinate (TOPROL-XL) 25 MG 24 hr tablet TAKE 1 TABLET BY MOUTH ONCE DAILY  . nitroGLYCERIN (NITROSTAT) 0.4 MG SL tablet Place 1 tablet (0.4 mg total) under the tongue as needed for chest pain.  . ranolazine (RANEXA) 500 MG 12 hr tablet  Take 1 tablet (500 mg total) by mouth 2 (two) times daily.  . Vitamin D, Ergocalciferol, (DRISDOL) 50000 units CAPS capsule TAKE ONE CAPSULE BY MOUTH ONCE EVERY 7 DAYS    No Known Allergies     Review of Systems:  Constitutional: No recent illness  HEENT: No  headache  Cardiac: No  chest pain, No  pressure, No palpitations  Respiratory:  No  shortness of breath. No  Cough  Gastrointestinal: No  abdominal pain  Musculoskeletal: No new  myalgia/arthralgia  Neurologic: No  weakness, No  Dizziness   Exam:  BP 127/66 (BP Location: Left Arm, Patient Position: Sitting, Cuff Size: Normal)   Pulse 78   Temp 98.1 F (36.7 C) (Oral)   Wt 191 lb 12.8 oz (87 kg)   BMI 29.16 kg/m   Constitutional: VS see above. General Appearance: alert, well-developed, well-nourished, NAD  Eyes: Normal lids and conjunctive, non-icteric sclera  Ears, Nose, Mouth, Throat: MMM, Normal external inspection ears/nares/mouth/lips/gums.  Neck: No masses, trachea midline.   Respiratory: Normal respiratory effort. no wheeze, no rhonchi, no rales  Cardiovascular: S1/S2 normal, no murmur, no rub/gallop auscultated. RRR.   Musculoskeletal: Gait normal. Symmetric and independent movement of all extremities  Neurological: Normal balance/coordination. No tremor.  Skin: warm, dry, intact.   Psychiatric: Normal judgment/insight. Normal mood and affect. Oriented x3.       Visit summary with medication list and pertinent instructions was printed for patient to review, patient was advised to alert [place] if any updates are needed. All questions at time of visit were answered - patient instructed to contact office with any additional concerns. ER/RTC precautions were reviewed with the patient and understanding verbalized.   Note: Total time spent 25 minutes, greater than 50% of the visit was spent face-to-face counseling and coordinating care for the following: The primary encounter diagnosis was Uncontrolled type 2 diabetes mellitus with hyperglycemia (HCC). Diagnoses of Coronary artery disease involving native coronary artery of native heart without angina pectoris and Essential hypertension were also pertinent to this visit.Marland Kitchen  Please note: voice recognition software was used to produce this document, and typos may escape review. Please contact Dr. Lyn Hollingshead for any needed clarifications.    Follow up plan: Return in about 3 months (around 02/28/2019) for  Diabetes follow-up .

## 2018-11-30 NOTE — Patient Instructions (Signed)
Can continue current medications Recheck in 3 months, see me sooner if needed!

## 2019-01-24 ENCOUNTER — Other Ambulatory Visit: Payer: Self-pay

## 2019-01-24 ENCOUNTER — Encounter: Payer: Self-pay | Admitting: Osteopathic Medicine

## 2019-01-24 ENCOUNTER — Ambulatory Visit (INDEPENDENT_AMBULATORY_CARE_PROVIDER_SITE_OTHER): Payer: Medicare Other

## 2019-01-24 ENCOUNTER — Ambulatory Visit (INDEPENDENT_AMBULATORY_CARE_PROVIDER_SITE_OTHER): Payer: Medicare Other | Admitting: Osteopathic Medicine

## 2019-01-24 VITALS — BP 128/54 | HR 78 | Temp 97.7°F | Wt 185.8 lb

## 2019-01-24 DIAGNOSIS — R0602 Shortness of breath: Secondary | ICD-10-CM | POA: Diagnosis not present

## 2019-01-24 DIAGNOSIS — I5021 Acute systolic (congestive) heart failure: Secondary | ICD-10-CM

## 2019-01-24 MED ORDER — FUROSEMIDE 40 MG PO TABS: ORAL_TABLET | ORAL | 0 refills | Status: DC

## 2019-01-24 NOTE — Patient Instructions (Signed)
Heart Failure °Heart failure is a condition in which the heart has trouble pumping blood because it has become weak or stiff. This means that the heart does not pump blood efficiently for the body to work well. For some people with heart failure, fluid may back up into the lungs and there may be swelling (edema) in the lower legs. Heart failure is usually a long-term (chronic) condition. It is important for you to take good care of yourself and follow the treatment plan from your health care provider. °What are the causes? °This condition is caused by some health problems, including: °· High blood pressure (hypertension). Hypertension causes the heart muscle to work harder than normal. High blood pressure eventually causes the heart to become stiff and weak. °· Coronary artery disease (CAD). CAD is the buildup of cholesterol and fat (plaques) in the arteries of the heart. °· Heart attack (myocardial infarction). Injured tissue, which is caused by the heart attack, does not contract as well and the heart's ability to pump blood is weakened. °· Abnormal heart valves. When the heart valves do not open and close properly, the heart muscle must pump harder to keep the blood flowing. °· Heart muscle disease (cardiomyopathy or myocarditis). Heart muscle disease is damage to the heart muscle from a variety of causes, such as drug or alcohol abuse, infections, or unknown causes. These can increase the risk of heart failure. °· Lung disease. When the lungs do not work properly, the heart must work harder. °What increases the risk? °Risk of heart failure increases as a person ages. This condition is also more likely to develop in people who: °· Are overweight. °· Are male. °· Smoke or chew tobacco. °· Abuse alcohol or illegal drugs. °· Have taken medicines that can damage the heart, such as chemotherapy drugs. °· Have diabetes. °? High blood sugar (glucose) is associated with high fat (lipid) levels in the blood. °? Diabetes  can also damage tiny blood vessels that carry nutrients to the heart muscle. °· Have abnormal heart rhythms. °· Have thyroid problems. °· Have low blood counts (anemia). °What are the signs or symptoms? °Symptoms of this condition include: °· Shortness of breath with activity, such as when climbing stairs. °· Persistent cough. °· Swelling of the feet, ankles, legs, or abdomen. °· Unexplained weight gain. °· Difficulty breathing when lying flat (orthopnea). °· Waking from sleep because of the need to sit up and get more air. °· Rapid heartbeat. °· Fatigue and loss of energy. °· Feeling light-headed, dizzy, or close to fainting. °· Loss of appetite. °· Nausea. °· Increased urination during the night (nocturia). °· Confusion. °How is this diagnosed? °This condition is diagnosed based on: °· Medical history, symptoms, and a physical exam. °· Diagnostic tests, which may include: °? Echocardiogram. °? Electrocardiogram (ECG). °? Chest X-ray. °? Blood tests. °? Exercise stress test. °? Radionuclide scans. °? Cardiac catheterization and angiogram. °How is this treated? °Treatment for this condition is aimed at managing the symptoms of heart failure. Medicines, behavioral changes, or other treatments may be necessary to treat heart failure. °Medicines °These may include: °· Angiotensin-converting enzyme (ACE) inhibitors. This type of medicine blocks the effects of a blood protein called angiotensin-converting enzyme. ACE inhibitors relax (dilate) the blood vessels and help to lower blood pressure. °· Angiotensin receptor blockers (ARBs). This type of medicine blocks the actions of a blood protein called angiotensin. ARBs dilate the blood vessels and help to lower blood pressure. °· Water pills (diuretics). Diuretics cause   the kidneys to remove salt and water from the blood. The extra fluid is removed through urination, leaving a lower volume of blood that the heart has to pump. °· Beta blockers. These improve heart muscle  strength and they prevent the heart from beating too quickly. °· Digoxin. This increases the force of the heartbeat. °Healthy behavior changes °These may include: °· Reaching and maintaining a healthy weight. °· Stopping smoking or chewing tobacco. °· Eating heart-healthy foods. °· Limiting or avoiding alcohol. °· Stopping use of street drugs (illegal drugs). °· Physical activity. °Other treatments °These may include: °· Surgery to open blocked coronary arteries or repair damaged heart valves. °· Placement of a biventricular pacemaker to improve heart muscle function (cardiac resynchronization therapy). This device paces both the right ventricle and left ventricle. °· Placement of a device to treat serious abnormal heart rhythms (implantable cardioverter defibrillator, or ICD). °· Placement of a device to improve the pumping ability of the heart (left ventricular assist device, or LVAD). °· Heart transplant. This can cure heart failure, and it is considered for certain patients who do not improve with other therapies. °Follow these instructions at home: °Medicines °· Take over-the-counter and prescription medicines only as told by your health care provider. Medicines are important in reducing the workload of your heart, slowing the progression of heart failure, and improving your symptoms. °? Do not stop taking your medicine unless your health care provider told you to do that. °? Do not skip any dose of medicine. °? Refill your prescriptions before you run out of medicine. You need your medicines every day. °Eating and drinking ° °· Eat heart-healthy foods. Talk with a dietitian to make an eating plan that is right for you. °? Choose foods that contain no trans fat and are low in saturated fat and cholesterol. Healthy choices include fresh or frozen fruits and vegetables, fish, lean meats, legumes, fat-free or low-fat dairy products, and whole-grain or high-fiber foods. °? Limit salt (sodium) if directed by your  health care provider. Sodium restriction may reduce symptoms of heart failure. Ask a dietitian to recommend heart-healthy seasonings. °? Use healthy cooking methods instead of frying. Healthy methods include roasting, grilling, broiling, baking, poaching, steaming, and stir-frying. °· Limit your fluid intake if directed by your health care provider. Fluid restriction may reduce symptoms of heart failure. °Lifestyle ° °· Stop smoking or using chewing tobacco. Nicotine and tobacco can damage your heart and your blood vessels. Do not use nicotine gum or patches before talking to your health care provider. °· Limit alcohol intake to no more than 1 drink per day for non-pregnant women and 2 drinks per day for men. One drink equals 12 oz of beer, 5 oz of wine, or 1½ oz of hard liquor. °? Drinking more than that is harmful to your heart. Tell your health care provider if you drink alcohol several times a week. °? Talk with your health care provider about whether any level of alcohol use is safe for you. °? If your heart has already been damaged by alcohol or you have severe heart failure, drinking alcohol should be stopped completely. °· Stop use of illegal drugs. °· Lose weight if directed by your health care provider. Weight loss may reduce symptoms of heart failure. °· Do moderate physical activity if directed by your health care provider. People who are elderly and people with severe heart failure should consult with a health care provider for physical activity recommendations. °Monitor important information ° °· Weigh   yourself every day. Keeping track of your weight daily helps you to notice excess fluid sooner. °? Weigh yourself every morning after you urinate and before you eat breakfast. °? Wear the same amount of clothing each time you weigh yourself. °? Record your daily weight. Provide your health care provider with your weight record. °· Monitor and record your blood pressure as told by your health care  provider. °· Check your pulse as told by your health care provider. °Dealing with extreme temperatures °· If the weather is extremely hot: °? Avoid vigorous physical activity. °? Use air conditioning or fans or seek a cooler location. °? Avoid caffeine and alcohol. °? Wear loose-fitting, lightweight, and light-colored clothing. °· If the weather is extremely cold: °? Avoid vigorous physical activity. °? Layer your clothes. °? Wear mittens or gloves, a hat, and a scarf when you go outside. °? Avoid alcohol. °General instructions °· Manage other health conditions such as hypertension, diabetes, thyroid disease, or abnormal heart rhythms as told by your health care provider. °· Learn to manage stress. If you need help to do this, ask your health care provider. °· Plan rest periods when fatigued. °· Get ongoing education and support as needed. °· Participate in or seek rehabilitation as needed to maintain or improve independence and quality of life. °· Stay up to date with immunizations. Keeping current on pneumococcal and influenza immunizations is especially important to prevent respiratory infections. °· Keep all follow-up visits as told by your health care provider. This is important. °Contact a health care provider if: °· You have a rapid weight gain. °· You have increasing shortness of breath that is unusual for you. °· You are unable to participate in your usual physical activities. °· You tire easily. °· You cough more than normal, especially with physical activity. °· You have any swelling or more swelling in areas such as your hands, feet, ankles, or abdomen. °· You are unable to sleep because it is hard to breathe. °· You feel like your heart is beating quickly (palpitations). °· You become dizzy or light-headed when you stand up. °Get help right away if: °· You have difficulty breathing. °· You notice or your family notices a change in your awareness, such as having trouble staying awake or having difficulty  with concentration. °· You have pain or discomfort in your chest. °· You have an episode of fainting (syncope). °This information is not intended to replace advice given to you by your health care provider. Make sure you discuss any questions you have with your health care provider. °Document Released: 09/28/2005 Document Revised: 08/27/2017 Document Reviewed: 04/22/2016 °Elsevier Interactive Patient Education © 2019 [employer] ° °

## 2019-01-24 NOTE — Progress Notes (Signed)
HPI: Matthew Coffey is a 85 y.o. male who  has a past medical history of Diabetes mellitus without complication (HCC), Hyperlipidemia, and Hypertension.  [facility], 01/24/19,  for chief complaint of:  New problem: shortness of breath   . Context: hx CAD s/p inferior MI 07/2016, aortic stenosis, hx PE and anticoagulated on Eliquis though cardiology recommended ASA 81 mg three times per week.  . Quality: SOB, more winded easily. No chest pain.  . Severity: mildly worse over past couple days . Duration: 2-3 days total . Timing/Modifying factors: worse w/ exertion  . Assoc signs/symptoms: LE swelling   Just saw cardiology, Dr Leeann Mustenaldo, 12/21/2018 (about a month ago), Echo was stable w/ aortic stenosis mod/severe and no mention of reduced EF, pt at that time was advised f/u one year w/ Echo.   Patient is accompanied by daughter who assists with history-taking.    At today's visit 01/24/19 ... PMH, PSH, FH reviewed and updated as needed.  Current medication list and allergy/intolerance hx reviewed and updated as needed. 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 A diagnosis of Acute systolic heart failure (HCC) was also pertinent to this visit.   Meeting clinical HF criteria, will get labs, XR, see if we can support this dx and will also see how he responds to diuresis. Labs as below to assess renal function, should have these back by tomorrow and can reduce lasix if needed, will be sure to check weight next visit as well.   Pt advised if worse needs to go to ER.    Orders Placed This Encounter  Procedures  . DG Chest 2  View  . CBC  . COMPLETE METABOLIC PANEL WITH GFR  . TSH  . B Nat Peptide  . EKG 12-Lead  . Rhythm ECG, report     Meds ordered this encounter  Medications  . furosemide (LASIX) 40 MG tablet    Sig: Take 2 tablet (80 mg total) po once today and tomorrow once in AM and once in PM, then take 1 tablet (40 mg total) po bid    Dispense:  30 tablet    Refill:  0    Pt instructions for HF printed    Follow-up plan: Return if symptoms worsen or fail to improve please go to ER, otherwise recheck in 2 days here w/ Dr A .                                                 ################################################# ################################################# ################################################# #################################################    No outpatient medications have been marked as taking for the 01/24/19 encounter (Appointment) with Sunnie NielsenAlexander, Pakou Rainbow, DO.    No Known Allergies     Review of Systems:  Constitutional: No recent illness  HEENT: No  headache, no vision change  Cardiac: No  chest pain, No  pressure, No palpitations  Respiratory:  +shortness of breath. No  Cough  Gastrointestinal: No  abdominal pain, no change on bowel habits  Musculoskeletal:  No new myalgia/arthralgia  Skin: No  Rash  Neurologic: No  weakness, No  Dizziness  Psychiatric: No  concerns with depression, No  concerns with anxiety  Exam:  BP (!) 128/54 (BP Location: Left Arm, Patient Position: Sitting, Cuff Size: Normal)   Pulse 78   Temp 97.7 F (36.5 C) (Oral)   Wt 185 lb 12.8 oz (84.3 kg)   SpO2 100%   BMI 28.25 kg/m   Constitutional: VS see above. General Appearance: alert, well-developed, well-nourished, NAD  Eyes: Normal lids and conjunctive, non-icteric sclera  Ears, Nose, Mouth, Throat: MMM, Normal external inspection ears/nares/mouth/lips/gums.  Neck: No masses, trachea midline.   Respiratory:  Normal respiratory effort. no wheeze, no rhonchi, very faint bilateral rales (?atelectasis?)  Cardiovascular: S1/S2 normal, +murmur, no rub/gallop auscultated. RRR.   Musculoskeletal: Gait normal. Symmetric and independent movement of all extremities  Neurological: Normal balance/coordination. No tremor.  Skin: warm, dry, intact.   Psychiatric: Normal judgment/insight. Normal mood and affect. Oriented x3.       Visit summary with medication list and pertinent instructions was printed for patient to review, patient was advised to alert [place] if any updates are needed. All questions at time of visit were answered - patient instructed to contact office with any additional concerns. ER/RTC precautions were reviewed with the patient and understanding verbalized.     Please note: voice recognition software was used to produce this document, and typos may escape review. Please contact Dr. Lyn Hollingshead for any needed clarifications.    Follow up plan: Return if symptoms worsen or fail to improve please go to ER, otherwise recheck in 2 days here w/ Dr A .

## 2019-01-25 LAB — COMPLETE METABOLIC PANEL WITH GFR
AG Ratio: 1.4 (calc) (ref 1.0–2.5)
ALT: 11 U/L (ref 9–46)
AST: 12 U/L (ref 10–35)
Albumin: 3.3 g/dL — ABNORMAL LOW (ref 3.6–5.1)
Alkaline phosphatase (APISO): 42 U/L (ref 35–144)
BUN/Creatinine Ratio: 12 (calc) (ref 6–22)
BUN: 20 mg/dL (ref 7–25)
CO2: 27 mmol/L (ref 20–32)
Calcium: 8.6 mg/dL (ref 8.6–10.3)
Chloride: 104 mmol/L (ref 98–110)
Creat: 1.65 mg/dL — ABNORMAL HIGH (ref 0.70–1.11)
GFR, Est African American: 43 mL/min/{1.73_m2} — ABNORMAL LOW (ref >=60)
GFR, Est Non African American: 37 mL/min/{1.73_m2} — ABNORMAL LOW (ref >=60)
Globulin: 2.3 g/dL (calc) (ref 1.9–3.7)
Glucose, Bld: 324 mg/dL — ABNORMAL HIGH (ref 65–99)
Potassium: 4.4 mmol/L (ref 3.5–5.3)
Sodium: 136 mmol/L (ref 135–146)
Total Bilirubin: 1.2 mg/dL (ref 0.2–1.2)
Total Protein: 5.6 g/dL — ABNORMAL LOW (ref 6.1–8.1)

## 2019-01-25 LAB — CBC
HCT: 31.1 % — ABNORMAL LOW (ref 38.5–50.0)
Hemoglobin: 10.3 g/dL — ABNORMAL LOW (ref 13.2–17.1)
MCH: 31.6 pg (ref 27.0–33.0)
MCHC: 33.1 g/dL (ref 32.0–36.0)
MCV: 95.4 fL (ref 80.0–100.0)
MPV: 12 fL (ref 7.5–12.5)
Platelets: 168 10*3/uL (ref 140–400)
RBC: 3.26 10*6/uL — ABNORMAL LOW (ref 4.20–5.80)
RDW: 12.8 % (ref 11.0–15.0)
WBC: 5 10*3/uL (ref 3.8–10.8)

## 2019-01-25 LAB — BRAIN NATRIURETIC PEPTIDE: Brain Natriuretic Peptide: 153 pg/mL — ABNORMAL HIGH (ref <100)

## 2019-01-25 LAB — TSH: TSH: 1.08 mIU/L (ref 0.40–4.50)

## 2019-01-26 ENCOUNTER — Ambulatory Visit: Payer: Medicare Other | Admitting: Osteopathic Medicine

## 2019-02-06 ENCOUNTER — Telehealth: Payer: Self-pay | Admitting: Osteopathic Medicine

## 2019-02-06 NOTE — Telephone Encounter (Signed)
I received a VM from Velva Harman for home health for this patient. She is needing a verbal plan of care to start Nursing and PT at this patient's home. It was supposed to start today but since there is no order it will wait until another day.  Kathy's number is [phone]. He has a follow up tomorrow and would like call back with orders. She is reporting that he was recently released from the hospital. Please advise.

## 2019-02-06 NOTE — Telephone Encounter (Signed)
Please contact to schedule hosp follow up. Thank you.

## 2019-02-06 NOTE — Telephone Encounter (Signed)
Appointment has already been scheduled for patient for 04/28. Per daughter Georgeann Oppenheim they are coming in.

## 2019-02-06 NOTE — Telephone Encounter (Signed)
Yes he needs a hospital follow-up with me OK to give verbal for whatever orders home health needs

## 2019-02-07 ENCOUNTER — Encounter: Payer: Self-pay | Admitting: Osteopathic Medicine

## 2019-02-07 ENCOUNTER — Ambulatory Visit (INDEPENDENT_AMBULATORY_CARE_PROVIDER_SITE_OTHER): Payer: Medicare Other | Admitting: Osteopathic Medicine

## 2019-02-07 ENCOUNTER — Inpatient Hospital Stay: Payer: Medicare Other | Admitting: Osteopathic Medicine

## 2019-02-07 VITALS — BP 110/72 | HR 84 | Temp 97.7°F | Wt 179.4 lb

## 2019-02-07 DIAGNOSIS — E1165 Type 2 diabetes mellitus with hyperglycemia: Secondary | ICD-10-CM

## 2019-02-07 DIAGNOSIS — R5381 Other malaise: Secondary | ICD-10-CM

## 2019-02-07 DIAGNOSIS — R262 Difficulty in walking, not elsewhere classified: Secondary | ICD-10-CM

## 2019-02-07 DIAGNOSIS — N183 Chronic kidney disease, stage 3 unspecified: Secondary | ICD-10-CM

## 2019-02-07 DIAGNOSIS — I10 Essential (primary) hypertension: Secondary | ICD-10-CM | POA: Diagnosis not present

## 2019-02-07 DIAGNOSIS — D508 Other iron deficiency anemias: Secondary | ICD-10-CM

## 2019-02-07 MED ORDER — STEEL ROLLING WALKER MISC: 1.0000 [IU] | 1 refills | Status: AC

## 2019-02-07 NOTE — Patient Instructions (Addendum)
Plan: STOP lasix / furosemide Keep all appointments as scheduled  Will recheck blood work Anemia, plus deconditioning, are most likely the cause of your fatigue. Do everything your physical therapist tells you!

## 2019-02-07 NOTE — Progress Notes (Addendum)
HPI: Matthew Coffey is a 85 y.o. male who  has a past medical history of Diabetes mellitus without complication (HCC), Hyperlipidemia, and Hypertension.  he presents [facility] today, 02/07/19,  for chief complaint of:  Hospital follow-up  Anemia   Was feeling more SOB, LE edema, I had some concern for heart failure so got labs and started Lasix, advised close follow-up. There was mild anemia on CBC, drop from previous, patient did not show up for follow-up appointment to discuss results. He presented to ED w/ cc generalized weakness, nothing remarkable on ED workup, pt was admitted for obs and GI consulted, EGD and colonoscopy ok (uncertain when melena first developed). Pt was admitted for 7 days.    EGD report 01/30/2019 "Recommendations: .Marland Kitchen.Marland Kitchen.Continue Protonix. Will plan for  colonoscopy tomorrow to further evaluate melena and acute on chronic blood  loss anemia. Continue to hold Eliquis. Findings: - Moderate duodenitis involving the duodenal bulb - Mild non-erosive antral gastritis. Biopsies were obtained to evaluate  for H. Pylori. - Otherwise normal upper endoscopy"  Colonoscopy report 01/31/2019 "Recommendations: No plans for additional inpatient endoscopic evaluation. Will arrange  outpatient small bowel video capsule endoscopy. OK to resume Eliquis.  Will sign off. Please call with questions. Findings: - Findings of prior right hemicolectomy with normal appearing anastomosis - Mild internal hemorrhoids - Otherwise normal colonoscopy"  Stomach, biopsy: 01/31/2019 "Gastric mucosa with active chronic gastritis and rare focal intestinal metaplasia.  See note. Negative for dysplasia. Note: An H. pylori immunostain shows rare focal positivity. Appropriate positive control(s) were reviewed in conjunction with the performed immunohistochemical stain(s). "  Labs on discharge:  Hgb 8.7, low iron, low ferritin  Cr stable 1.7  Other  labs: Tropes trended down over 01/26/19 TSH ok  UA ok COVID neg   At today's visit 02/07/19 ... PMH, PSH, FH reviewed and updated as needed.  Current medication list and allergy/intolerance hx reviewed and updated as needed. (See remainder of HPI, ROS, Phys Exam below)   No results found.  No results found for this or any previous visit (from the past 72 hour(s)).        ASSESSMENT/PLAN: The primary encounter diagnosis was Other iron deficiency anemia. Diagnoses of Essential hypertension, benign, Uncontrolled type 2 diabetes mellitus with hyperglycemia (HCC), Chronic kidney disease, stage 3 (HCC), Physical debility, and Ambulatory dysfunction were also pertinent to this visit.   Orders Placed This Encounter  Procedures  . CBC  . Fe+TIBC+Fer  . COMPLETE METABOLIC PANEL WITH GFR     Meds ordered this encounter  Medications  . Misc. Devices (STEEL ROLLING WALKER) MISC    Sig: 1 Units by Does not apply route as directed. Dispense rolling walker with seat per patient preference and insurance coverage dx gait instability, fall risk, debility, CAD, DM2    Dispense:  1 each    Refill:  1   Patient Instructions  Plan: STOP lasix / furosemide Keep all appointments as scheduled  Will recheck blood work Anemia, plus deconditioning, are most likely the cause of your fatigue. Do everything your physical therapist tells you!        Follow-up plan: Return in about 4 weeks (around 03/07/2019) for A1C recheck - sooner if needed .         ADDENDUM 02/08/19 8:38 AM    Results for orders placed or performed in visit on 02/07/19 (from the past 24 hour(s))  CBC     Status: Abnormal   Collection Time:  02/07/19  2:54 PM  Result Value Ref Range   WBC 5.1 3.8 - 10.8 Thousand/uL   RBC 2.60 (L) 4.20 - 5.80 Million/uL   Hemoglobin 7.8 (L) 13.2 - 17.1 g/dL   HCT 01.0 (L) 93.2 - 35.5 %   MCV 94.2 80.0 - 100.0 fL   MCH 30.0 27.0 - 33.0 pg   MCHC 31.8 (L) 32.0 - 36.0 g/dL    RDW 73.2 20.2 - 54.2 %   Platelets 169 140 - 400 Thousand/uL   MPV 12.1 7.5 - 12.5 fL  Fe+TIBC+Fer     Status: Abnormal   Collection Time: 02/07/19  2:54 PM  Result Value Ref Range   Iron 45 (L) 50 - 180 mcg/dL   TIBC 706 237 - 628 mcg/dL (calc)   %SAT 14 (L) 20 - 48 % (calc)   Ferritin 34 24 - 380 ng/mL  COMPLETE METABOLIC PANEL WITH GFR     Status: Abnormal   Collection Time: 02/07/19  2:54 PM  Result Value Ref Range   Glucose, Bld 288 (H) 65 - 99 mg/dL   BUN 37 (H) 7 - 25 mg/dL   Creat 3.15 (H) 1.76 - 1.11 mg/dL   GFR, Est Non African American 29 (L) > OR = 60 mL/min/1.87m2   GFR, Est African American 33 (L) > OR = 60 mL/min/1.41m2   BUN/Creatinine Ratio 18 6 - 22 (calc)   Sodium 137 135 - 146 mmol/L   Potassium 3.9 3.5 - 5.3 mmol/L   Chloride 100 98 - 110 mmol/L   CO2 27 20 - 32 mmol/L   Calcium 9.1 8.6 - 10.3 mg/dL   Total Protein 5.6 (L) 6.1 - 8.1 g/dL   Albumin 3.4 (L) 3.6 - 5.1 g/dL   Globulin 2.2 1.9 - 3.7 g/dL (calc)   AG Ratio 1.5 1.0 - 2.5 (calc)   Total Bilirubin 1.2 0.2 - 1.2 mg/dL   Alkaline phosphatase (APISO) 39 35 - 144 U/L   AST 11 10 - 35 U/L   ALT 10 9 - 46 U/L    See lab result note   Pt has upcoming appt w/ GI for capsule endoscopy, hopefully this will give [place] some answers  He is symptomatically anemic however and I think he might benefit from transfusion so I asked Hematology team if this was possible as outpatient, unfortunately not, plus patient was feeling worse last night.   Daughter reports will transport him to [facility] ER. I spoke to charge nurse 02/08/19 11:51 AM regarding the patient and my concern for need for transfusion pending further eval.                                        ################################################# ################################################# ################################################# #################################################    Current Meds   Medication Sig  . AMBULATORY NON FORMULARY MEDICATION Single glucometer with lancets, test strips for testing 3x daily.  Use large print meter or a talking meter.  Disp 1 meter. Disp strips and lancets 90 day supply for 3x daily testing. E11.65  . apixaban (ELIQUIS) 5 MG TABS tablet Take 1 tablet (5 mg total) by mouth 2 (two) times daily.  Marland Kitchen atorvastatin (LIPITOR) 80 MG tablet TAKE 1 TABLET BY MOUTH ONCE DAILY  . Insulin Glargine (TOUJEO MAX SOLOSTAR) 300 UNIT/ML SOPN Inject 40 Units into the skin 2 (two) times daily as needed.  . Insulin Pen Needle 31G X 5  MM MISC Use fresh needle to inject insulin subcutaneously daily. Dx: Insulin Dependent Type 2 Diabetes E11.9, Z79.4  . IRON PO Take by mouth.  . isosorbide mononitrate (IMDUR) 30 MG 24 hr tablet TAKE 1 TABLET BY MOUTH ONCE DAILY  . metoprolol succinate (TOPROL-XL) 25 MG 24 hr tablet TAKE 1 TABLET BY MOUTH ONCE DAILY  . pantoprazole (PROTONIX) 40 MG tablet Take by mouth.  . ranolazine (RANEXA) 500 MG 12 hr tablet Take 1 tablet (500 mg total) by mouth 2 (two) times daily.  . Vitamin D, Ergocalciferol, (DRISDOL) 50000 units CAPS capsule TAKE ONE CAPSULE BY MOUTH ONCE EVERY 7 DAYS  . [DISCONTINUED] furosemide (LASIX) 40 MG tablet Take 2 tablet (80 mg total) po once today and tomorrow once in AM and once in PM, then take 1 tablet (40 mg total) po bid    No Known Allergies     Review of Systems:  Constitutional: +recent illness per HPI, +seere fatigue  HEENT: No  headache, no vision change  Cardiac: No  chest pain, No  pressure, No palpitations  Respiratory:  +shortness of breath. No  Cough  Gastrointestinal: No  abdominal pain  Musculoskeletal: No new myalgia/arthralgia  Skin: No  Rash  Neurologic: +generalized weakness, No  Dizziness  Psychiatric: No  concerns with depression, No  concerns with anxiety  Exam:  BP 110/72 (BP Location: Left Arm, Patient Position: Sitting, Cuff Size: Normal)   Pulse 84   Temp 97.7 F (36.5  C) (Oral)   Wt 179 lb 6.4 oz (81.4 kg)   SpO2 100%   BMI 27.28 kg/m   Constitutional: VS see above. General Appearance: alert, well-developed, well-nourished, NAD  Eyes: Normal lids and conjunctive, non-icteric sclera  Ears, Nose, Mouth, Throat: MMM, Normal external inspection ears/nares/mouth/lips/gums.  Neck: No masses, trachea midline.   Respiratory: Normal respiratory effort. no wheeze, no rhonchi, no rales  Cardiovascular: S1/S2 normal, no murmur, no rub/gallop auscultated. RRR.   Musculoskeletal: Gait normal. Symmetric and independent movement of all extremities  Neurological: Normal balance/coordination. No tremor.  Skin: warm, dry, intact.   Psychiatric: Normal judgment/insight. Normal mood and affect. Oriented x3.       Visit summary with medication list and pertinent instructions was printed for patient to review, patient was advised to alert [place] if any updates are needed. All questions at time of visit were answered - patient instructed to contact office with any additional concerns. ER/RTC precautions were reviewed with the patient and understanding verbalized.   Note: Total time spent 25 minutes, greater than 50% of the visit was spent face-to-face counseling and coordinating care for the following: The primary encounter diagnosis was Other iron deficiency anemia. Diagnoses of Essential hypertension, benign, Uncontrolled type 2 diabetes mellitus with hyperglycemia (HCC), Chronic kidney disease, stage 3 (HCC), Physical debility, and Ambulatory dysfunction were also pertinent to this visit.Marland Kitchen  Please note: voice recognition software was used to produce this document, and typos may escape review. Please contact Dr. Lyn Hollingshead for any needed clarifications.    Follow up plan: Return in about 4 weeks (around 03/07/2019) for A1C recheck - sooner if needed .

## 2019-02-08 LAB — COMPLETE METABOLIC PANEL WITH GFR
AG Ratio: 1.5 (calc) (ref 1.0–2.5)
ALT: 10 U/L (ref 9–46)
AST: 11 U/L (ref 10–35)
Albumin: 3.4 g/dL — ABNORMAL LOW (ref 3.6–5.1)
Alkaline phosphatase (APISO): 39 U/L (ref 35–144)
BUN/Creatinine Ratio: 18 (calc) (ref 6–22)
BUN: 37 mg/dL — ABNORMAL HIGH (ref 7–25)
CO2: 27 mmol/L (ref 20–32)
Calcium: 9.1 mg/dL (ref 8.6–10.3)
Chloride: 100 mmol/L (ref 98–110)
Creat: 2.03 mg/dL — ABNORMAL HIGH (ref 0.70–1.11)
GFR, Est African American: 33 mL/min/{1.73_m2} — ABNORMAL LOW (ref >=60)
GFR, Est Non African American: 29 mL/min/{1.73_m2} — ABNORMAL LOW (ref >=60)
Globulin: 2.2 g/dL (calc) (ref 1.9–3.7)
Glucose, Bld: 288 mg/dL — ABNORMAL HIGH (ref 65–99)
Potassium: 3.9 mmol/L (ref 3.5–5.3)
Sodium: 137 mmol/L (ref 135–146)
Total Bilirubin: 1.2 mg/dL (ref 0.2–1.2)
Total Protein: 5.6 g/dL — ABNORMAL LOW (ref 6.1–8.1)

## 2019-02-08 LAB — CBC
HCT: 24.5 % — ABNORMAL LOW (ref 38.5–50.0)
Hemoglobin: 7.8 g/dL — ABNORMAL LOW (ref 13.2–17.1)
MCH: 30 pg (ref 27.0–33.0)
MCHC: 31.8 g/dL — ABNORMAL LOW (ref 32.0–36.0)
MCV: 94.2 fL (ref 80.0–100.0)
MPV: 12.1 fL (ref 7.5–12.5)
Platelets: 169 10*3/uL (ref 140–400)
RBC: 2.6 10*6/uL — ABNORMAL LOW (ref 4.20–5.80)
RDW: 14.2 % (ref 11.0–15.0)
WBC: 5.1 10*3/uL (ref 3.8–10.8)

## 2019-02-08 LAB — IRON,TIBC AND FERRITIN PANEL
%SAT: 14 % (calc) — ABNORMAL LOW (ref 20–48)
Ferritin: 34 ng/mL (ref 24–380)
Iron: 45 ug/dL — ABNORMAL LOW (ref 50–180)
TIBC: 318 mcg/dL (calc) (ref 250–425)

## 2019-02-08 MED ORDER — BARO-CAT PO
10.00 | ORAL | Status: DC
Start: ? — End: 2019-02-08

## 2019-02-08 MED ORDER — SODIUM CHLORIDE 0.9 % IV SOLN
10.00 | INTRAVENOUS | Status: DC
Start: ? — End: 2019-02-08

## 2019-02-08 MED ORDER — GENERIC EXTERNAL MEDICATION
Status: DC
Start: ? — End: 2019-02-08

## 2019-02-08 NOTE — Addendum Note (Signed)
Addended by: Deirdre Pippins on: 02/08/2019 11:52 AM   Modules accepted: Orders

## 2019-02-14 ENCOUNTER — Ambulatory Visit (INDEPENDENT_AMBULATORY_CARE_PROVIDER_SITE_OTHER): Payer: Medicare Other | Admitting: Osteopathic Medicine

## 2019-02-14 ENCOUNTER — Encounter: Payer: Self-pay | Admitting: Osteopathic Medicine

## 2019-02-14 VITALS — BP 126/54 | HR 70 | Temp 97.7°F | Wt 186.9 lb

## 2019-02-14 DIAGNOSIS — D649 Anemia, unspecified: Secondary | ICD-10-CM | POA: Diagnosis not present

## 2019-02-14 NOTE — Progress Notes (Signed)
HPI: Matthew Coffey is a 85 y.o. male who  has a past medical history of Diabetes mellitus without complication (HCC), Hyperlipidemia, and Hypertension.  he presents to [facility] today, 02/14/19,  for chief complaint of: Hospital follow up - anemia  . I sent him to ER 02/08/2019 (6 days ago) for concern dropping Hgb and symptomatic anemia.  o STOPPED Eliquis and ASA o Afib noted in ER  o 2 units blood total, one on 02/10/19 and one on 02/11/19 o Planning for capsule endoscopy  o Follow up in place w/ GI and cardiology though I don't see GI on the computer system  . Today patient reports feeling better, more energetic. No complaints. Daughter has also noticed improvement.      Patient is accompanied by daughter, Doroteo Glassman, who assists with history-taking.   Past medical, surgical, social and family history reviewed:  Patient Active Problem List   Diagnosis Date Noted  . Traumatic right great toenail avulsion 02/15/2019  . Poor historian 08/29/2018  . History of pulmonary embolism 12/08/2017  . Coronary artery disease 11/01/2016  . Right bundle branch block 11/01/2016  . Aortic stenosis 11/01/2016  . ACS (acute coronary syndrome) (HCC) 07/11/2016  . Non-STEMI (non-ST elevated myocardial infarction) (HCC) 07/11/2016  . Thrombocytopenia (HCC) 07/11/2016  . Left knee pain 12/19/2015  . History of nephrectomy 09/16/2015  . Type II diabetes mellitus, uncontrolled (HCC) 09/10/2015  . Hyperlipidemia 09/10/2015  . Essential hypertension, benign 09/10/2015  . Chronic kidney disease, stage 3 (HCC) 09/10/2015  . Arthritis, senescent 09/10/2015    Past Surgical History:  Procedure Laterality Date  . COLECTOMY  07/1999  . kidney removed Right     Social History   Tobacco Use  . Smoking status: Former Smoker    Last attempt to quit: 10/12/1973    Years since quitting: 45.3  . Smokeless tobacco: Never Used  Substance Use Topics  . Alcohol use: No     Family History  Problem Relation Age of Onset  . Heart attack Mother      Current medication list and allergy/intolerance information reviewed:    Current Outpatient Medications  Medication Sig Dispense Refill  . AMBULATORY NON FORMULARY MEDICATION Single glucometer with lancets, test strips for testing 3x daily.  Use large print meter or a talking meter.  Disp 1 meter. Disp strips and lancets 90 day supply for 3x daily testing. E11.65 1 each 0  . atorvastatin (LIPITOR) 80 MG tablet TAKE 1 TABLET BY MOUTH ONCE DAILY 90 tablet 3  . Insulin Glargine (TOUJEO MAX SOLOSTAR) 300 UNIT/ML SOPN Inject 40 Units into the skin 2 (two) times daily as needed. 24 mL 3  . Insulin Pen Needle 31G X 5 MM MISC Use fresh needle to inject insulin subcutaneously daily. Dx: Insulin Dependent Type 2 Diabetes E11.9, Z79.4 100 each 99  . IRON PO Take by mouth.    . metoprolol succinate (TOPROL-XL) 25 MG 24 hr tablet TAKE 1 TABLET BY MOUTH ONCE DAILY 90 tablet 3  . Misc. Devices (STEEL ROLLING WALKER) MISC 1 Units by Does not apply route as directed. Dispense rolling walker with seat per patient preference and insurance coverage dx gait instability, fall risk, debility, CAD, DM2 1 each 1  . pantoprazole (PROTONIX) 40 MG tablet Take by mouth.    . ranolazine (RANEXA) 500 MG 12 hr tablet Take 1 tablet (500 mg total) by mouth 2 (two) times daily. 180 tablet 1  . Vitamin D, Ergocalciferol, (DRISDOL) 50000  units CAPS capsule TAKE ONE CAPSULE BY MOUTH ONCE EVERY 7 DAYS 12 capsule 3  . apixaban (ELIQUIS) 5 MG TABS tablet Take 1 tablet (5 mg total) by mouth 2 (two) times daily. (Patient not taking: Reported on 02/14/2019) 180 tablet 1  . ciprofloxacin (CIPRO) 750 MG tablet Take 1 tablet (750 mg total) by mouth 2 (two) times daily for 7 days. 14 tablet 0  . doxycycline (VIBRA-TABS) 100 MG tablet Take 1 tablet (100 mg total) by mouth 2 (two) times daily for 7 days. 14 tablet 0  . isosorbide mononitrate (IMDUR) 30 MG 24 hr  tablet Take 2 tablets (60 mg total) by mouth daily. 90 tablet 3  . metroNIDAZOLE (FLAGYL) 500 MG tablet Take 1 tablet (500 mg total) by mouth 2 (two) times daily for 7 days. 14 tablet 0  . nitroGLYCERIN (NITROSTAT) 0.4 MG SL tablet Place 1 tablet (0.4 mg total) under the tongue as needed for chest pain. 15 tablet 5   No current facility-administered medications for this visit.     No Known Allergies    Review of Systems:  Constitutional:  No  fever, no chills,+significant fatigue.   HEENT: No  headache, no vision change  Cardiac: No  chest pain, No  pressure, No palpitations, No  Orthopnea  Respiratory:  +shortness of breath. No  Cough  Gastrointestinal: No  abdominal pain, No  nausea, No  vomiting,  No  blood in stool  Musculoskeletal: No new myalgia/arthralgia  Exam:  BP (!) 126/54 (BP Location: Left Arm, Patient Position: Sitting, Cuff Size: Normal)   Pulse 70   Temp 97.7 F (36.5 C) (Oral)   Wt 186 lb 14.4 oz (84.8 kg)   BMI 28.42 kg/m   Constitutional: VS see above. General Appearance: alert, well-developed, well-nourished, NAD  Eyes: Normal lids and conjunctive, non-icteric sclera  Neck: No masses, trachea midline. No thyroid enlargement. No tenderness/mass appreciated. No lymphadenopathy  Respiratory: Normal respiratory effort. no wheeze, no rhonchi, no rales  Cardiovascular: S1/S2 normal, no murmur, no rub/gallop auscultated. RRR. Trace lower extremity edema  Musculoskeletal: Gait normal. No clubbing/cyanosis of digits.   Neurological: Normal balance/coordination. No tremor.   Skin: warm, dry, intact.    Psychiatric: Normal judgment/insight. Normal mood and affect. Oriented x3.    Results for orders placed or performed in visit on 02/14/19 (from the past 72 hour(s))  CBC     Status: Abnormal   Collection Time: 02/15/19  2:25 PM  Result Value Ref Range   WBC 4.3 3.8 - 10.8 Thousand/uL   RBC 3.72 (L) 4.20 - 5.80 Million/uL   Hemoglobin 11.1 (L) 13.2 -  17.1 g/dL   HCT 86.533.8 (L) 78.438.5 - 69.650.0 %   MCV 90.9 80.0 - 100.0 fL   MCH 29.8 27.0 - 33.0 pg   MCHC 32.8 32.0 - 36.0 g/dL   RDW 29.514.4 28.411.0 - 13.215.0 %   Platelets 164 140 - 400 Thousand/uL   MPV 12.3 7.5 - 12.5 fL      ASSESSMENT/PLAN: The encounter diagnosis was Symptomatic anemia.   Improving after transfusion, capsule endoscopy upcoming.   Orders Placed This Encounter  Procedures  . CBC    No orders of the defined types were placed in this encounter.   There are no Patient Instructions on file for this visit.      Visit summary with medication list and pertinent instructions was printed for patient to review. All questions at time of visit were answered - patient instructed to contact office  with any additional concerns or updates. ER/RTC precautions were reviewed with the patient.   Note: Total time spent 25 minutes, greater than 50% of the visit was spent face-to-face counseling and coordinating care for the above diagnoses listed in assessment/plan.   Please note: voice recognition software was used to produce this document, and typos may escape review. Please contact Dr. Lyn Hollingshead for any needed clarifications.     Follow-up plan: Return in about 6 weeks (around 03/28/2019) for recheck A1C / diabetes, see me sooner if needed! Marland Kitchen

## 2019-02-15 ENCOUNTER — Ambulatory Visit (INDEPENDENT_AMBULATORY_CARE_PROVIDER_SITE_OTHER): Payer: Medicare Other | Admitting: Sports Medicine

## 2019-02-15 ENCOUNTER — Encounter: Payer: Self-pay | Admitting: Sports Medicine

## 2019-02-15 ENCOUNTER — Ambulatory Visit: Payer: Medicare Other | Admitting: Family Medicine

## 2019-02-15 DIAGNOSIS — S91209A Unspecified open wound of unspecified toe(s) with damage to nail, initial encounter: Secondary | ICD-10-CM | POA: Diagnosis not present

## 2019-02-15 MED ORDER — DOXYCYCLINE HYCLATE 100 MG PO TABS: 100.0000 mg | ORAL_TABLET | Freq: Two times a day (BID) | ORAL | 0 refills | Status: AC

## 2019-02-15 MED ORDER — CIPROFLOXACIN HCL 750 MG PO TABS: 750.0000 mg | ORAL_TABLET | Freq: Two times a day (BID) | ORAL | 0 refills | Status: AC

## 2019-02-15 MED ORDER — METRONIDAZOLE 500 MG PO TABS: 500.0000 mg | ORAL_TABLET | Freq: Two times a day (BID) | ORAL | 0 refills | Status: AC

## 2019-02-15 NOTE — Patient Instructions (Signed)
Nail Avulsion  Nail avulsion is when a nail tears away from the nail bed due to an accident or injury. Nail avulsion can be painful. Your finger or toe may bleed a lot, and you may have some pain, redness, throbbing, and swelling while it heals. Your nail will grow back within several months. Once it grows back, it might not look the same as the old nail. This may happen even after taking good care of it.  Follow these instructions at home:  Wound care  · Follow instructions from your health care provider about how to take care of your wound. Make sure you:  ? Wash your hands with soap and water before and after you change your bandage (dressing). If soap and water are not available, use hand sanitizer.  ? Change your dressing as told by your health care provider.  ? Leave stitches (sutures), skin glue, or adhesive strips in place, if present. These skin closures may need to stay in place for 2 weeks or longer. If adhesive strip edges start to loosen and curl up, you may trim the loose edges. Do not remove adhesive strips completely unless your health care provider tells you to do that.  · Check your wound every day for signs of infection. Check for:  ? Redness, swelling, or pain.  ? Fluid or blood.  ? Warmth.  ? Pus or a bad smell.  · Do not take baths, swim, or use a hot tub until your health care provider approves. Ask your health care provider if you may take showers. You may only be allowed to take sponge baths.  · If you notice bleeding, press gently on the nail bed with a gauze pad. Do this for 15 minutes.  · Keep the wound dry for 48 hours. After 48 hours have passed, lightly wash the finger or toe in warm, soapy water 2-3 times a day. This helps to reduce pain and swelling. It also prevents infection.  Medicine  · Take over-the-counter and prescription medicines only as told by your health care provider. Do not take aspirin or products containing aspirin unless directed by your health care provider. These  products can increase bleeding.  · If you were prescribed an antibiotic medicine, take it or apply it as told by your health care provider. Do not stop taking or using the antibiotic even if you start to feel better.  General instructions    · Keep the injured hand or foot raised above the level of your heart as much as possible in the first 48 hours after the injury. This helps to reduce pain and swelling.  · Move the toe or finger often to avoid stiffness.  · Do not use any products that contain nicotine or tobacco, such as cigarettes, e-cigarettes, and chewing tobacco. These can delay healing. If you need help quitting, ask your health care provider.  Contact a health care provider if:  · You have increased redness, swelling, or pain around your wound.  · You have fluid or blood coming from your wound.  · You have pus or a bad smell coming from your wound.  · Your wound or the area around your wound feels warm to the touch.  Get help right away if:  · You have bleeding that does not stop, even when you apply pressure to the wound.  · You have a temperature that is higher than 100.4°F (38°C).  · The affected finger or toe looks white or black.    Summary  · Nail avulsion is when a nail tears away from the nail bed due to an accident or injury.  · Follow instructions from your health care provider about how to take care of your wound.  · Keep the injured hand or foot raised above the level of your heart as much as possible in the first 48 hours after the injury. This helps to reduce pain and swelling.  · Check your wound every day for signs of infection.  · Contact a health care provider if you have increased redness, swelling, or pain around your wound.  This information is not intended to replace advice given to you by your health care provider. Make sure you discuss any questions you have with your health care provider.  Document Released: 11/05/2004 Document Revised: 05/25/2018 Document Reviewed:  05/25/2018  [employer] Interactive Patient Education © 2019 [employer]

## 2019-02-15 NOTE — Progress Notes (Signed)
Subjective:    CC: Toenail injury  HPI: Matthew Coffey is a very pleasant 85 year old male, he was working in his flip-flops, his foot was injured by a barrel, and he thinks he ripped off his toenail.  Pain is moderate, persistent, localized without radiation.  I reviewed the past medical history, family history, social history, surgical history, and allergies today and no changes were needed.  Please see the problem list section below in epic for further details.  Past Medical History: Past Medical History:  Diagnosis Date  . Diabetes mellitus without complication (HCC)   . Hyperlipidemia   . Hypertension    Past Surgical History: Past Surgical History:  Procedure Laterality Date  . COLECTOMY  07/1999  . kidney removed Right    Social History: Social History   Socioeconomic History  . Marital status: Married    Spouse name: Not on file  . Number of children: Not on file  . Years of education: Not on file  . Highest education level: Not on file  Occupational History  . Not on file  Social Needs  . Financial resource strain: Not on file  . Food insecurity:    Worry: Not on file    Inability: Not on file  . Transportation needs:    Medical: Not on file    Non-medical: Not on file  Tobacco Use  . Smoking status: Former Smoker    Last attempt to quit: 10/12/1973    Years since quitting: 45.3  . Smokeless tobacco: Never Used  Substance and Sexual Activity  . Alcohol use: No  . Drug use: No  . Sexual activity: Not on file  Lifestyle  . Physical activity:    Days per week: Not on file    Minutes per session: Not on file  . Stress: Not on file  Relationships  . Social connections:    Talks on phone: Not on file    Gets together: Not on file    Attends religious service: Not on file    Active member of club or organization: Not on file    Attends meetings of clubs or organizations: Not on file    Relationship status: Not on file  Other Topics Concern  . Not on file   Social History Narrative  . Not on file   Family History: Family History  Problem Relation Age of Onset  . Heart attack Mother    Allergies: No Known Allergies Medications: See med rec.  Review of Systems: No fevers, chills, night sweats, weight loss, chest pain, or shortness of breath.   Objective:    General: Well Developed, well nourished, and in no acute distress.  Neuro: Alert and oriented x3, extra-ocular muscles intact, sensation grossly intact.  HEENT: Normocephalic, atraumatic, pupils equal round reactive to light, neck supple, no masses, no lymphadenopathy, thyroid nonpalpable.  Skin: Warm and dry, no rashes. Cardiac: Regular rate and rhythm, no murmurs rubs or gallops, no lower extremity edema.  Respiratory: Clear to auscultation bilaterally. Not using accessory muscles, speaking in full sentences. Right foot: Toenail is partially avulsed, there is a bit of a laceration over the skin distally but I do not see any overt lacerations into the nailbed.  I do need to remove the toenail to inspect the nailbed itself.  Procedure:  Removal of right great toenail. Risks, benefits, alternatives explained to patient. Consent obtained. Time out conducted. Noted no overlying induration or erythema at site of injection. Toe cleaned with alcohol, then a total of 10cc  lidocaine 1% infiltrated at adjacent webspaces at the location of the bifurcation of the common digital nerve to proper digital nerves.  Some lidocaine also infiltrated at hyponychium and under nail bed.  Adequate anesthesia ensured. Toe prepped and draped in a sterile fashion. Nail elevator used to separate nail plate from nail bed. Hemostat then used to separate nail fragment from surrounding structures. Nailbed inspected, no large lacerations visualized. Minor bleeding controlled with pressure. I applied Xeroform dressing followed by gauze and Coban for pressure. Advised to return if increased redness, swelling,  drainage, fevers, or chills.  Impression and Recommendations:    Traumatic right great toenail avulsion Toenail was not salvageable, significant onychodystrophy, I removed the toenail and aggressively cleaned the nail bed, the nail matrix, there was no need to repair any of the nailbed itself. Xeroform applied, gauze, and then a Coban wrap. Change dressing every 2 days. 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 Follow-up with Dr. Lyn HollingsheadAlexander in a week.   ___________________________________________ Ihor Austinhomas J. Benjamin Stainhekkekandam, M.D., ABFM., CAQSM. Primary Care and Sports Medicine [facility]  Adjunct Professor of Family Medicine  [employer]

## 2019-02-15 NOTE — Assessment & Plan Note (Signed)
Toenail was not salvageable, significant onychodystrophy, I removed the toenail and aggressively cleaned the nail bed, the nail matrix, there was no need to repair any of the nailbed itself. Xeroform applied, gauze, and then a Coban wrap. Change dressing every 2 days. 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 Follow-up with Dr. Lyn Hollingshead in a week.

## 2019-02-16 ENCOUNTER — Encounter: Payer: Self-pay | Admitting: Osteopathic Medicine

## 2019-02-16 LAB — CBC
HCT: 33.8 % — ABNORMAL LOW (ref 38.5–50.0)
Hemoglobin: 11.1 g/dL — ABNORMAL LOW (ref 13.2–17.1)
MCH: 29.8 pg (ref 27.0–33.0)
MCHC: 32.8 g/dL (ref 32.0–36.0)
MCV: 90.9 fL (ref 80.0–100.0)
MPV: 12.3 fL (ref 7.5–12.5)
Platelets: 164 10*3/uL (ref 140–400)
RBC: 3.72 10*6/uL — ABNORMAL LOW (ref 4.20–5.80)
RDW: 14.4 % (ref 11.0–15.0)
WBC: 4.3 10*3/uL (ref 3.8–10.8)

## 2019-02-17 ENCOUNTER — Telehealth: Payer: Self-pay | Admitting: Osteopathic Medicine

## 2019-02-17 NOTE — Telephone Encounter (Signed)
I spoke with Sharyl Nimrod and he is an OT evaluation today and does not need it for next week. FYI.

## 2019-02-22 ENCOUNTER — Telehealth: Payer: Self-pay

## 2019-02-22 ENCOUNTER — Ambulatory Visit (INDEPENDENT_AMBULATORY_CARE_PROVIDER_SITE_OTHER): Payer: Medicare Other | Admitting: Sports Medicine

## 2019-02-22 ENCOUNTER — Encounter: Payer: Self-pay | Admitting: Sports Medicine

## 2019-02-22 DIAGNOSIS — L989 Disorder of the skin and subcutaneous tissue, unspecified: Secondary | ICD-10-CM | POA: Diagnosis not present

## 2019-02-22 DIAGNOSIS — L559 Sunburn, unspecified: Secondary | ICD-10-CM | POA: Diagnosis not present

## 2019-02-22 DIAGNOSIS — D0462 Carcinoma in situ of skin of left upper limb, including shoulder: Secondary | ICD-10-CM | POA: Insufficient documentation

## 2019-02-22 DIAGNOSIS — S91209A Unspecified open wound of unspecified toe(s) with damage to nail, initial encounter: Secondary | ICD-10-CM | POA: Diagnosis not present

## 2019-02-22 MED ORDER — TRIAMCINOLONE ACETONIDE 0.1 % EX CREA: 1.0000 "application " | TOPICAL_CREAM | Freq: Two times a day (BID) | CUTANEOUS | 0 refills | Status: AC

## 2019-02-22 NOTE — Assessment & Plan Note (Signed)
Toe looks good after nail plate removal due to avulsion. Good granulation tissue, no signs of infection. He can return to see me as needed for this.

## 2019-02-22 NOTE — Progress Notes (Signed)
Subjective:    CC: Follow-up  HPI: This is a pleasant 85 year old male, at the last visit we removed his right great toenail, he did have an avulsion.  It is healing well today.  He notes a reddish rash over the dorsum of both hands and both shins.  He has been out in the sun for long period of time.  Lesion over the left hand: Present for years.  I reviewed the past medical history, family history, social history, surgical history, and allergies today and no changes were needed.  Please see the problem list section below in epic for further details.  Past Medical History: Past Medical History:  Diagnosis Date  . Diabetes mellitus without complication (HCC)   . Hyperlipidemia   . Hypertension    Past Surgical History: Past Surgical History:  Procedure Laterality Date  . COLECTOMY  07/1999  . kidney removed Right    Social History: Social History   Socioeconomic History  . Marital status: Married    Spouse name: Not on file  . Number of children: Not on file  . Years of education: Not on file  . Highest education level: Not on file  Occupational History  . Not on file  Social Needs  . Financial resource strain: Not on file  . Food insecurity:    Worry: Not on file    Inability: Not on file  . Transportation needs:    Medical: Not on file    Non-medical: Not on file  Tobacco Use  . Smoking status: Former Smoker    Last attempt to quit: 10/12/1973    Years since quitting: 45.3  . Smokeless tobacco: Never Used  Substance and Sexual Activity  . Alcohol use: No  . Drug use: No  . Sexual activity: Not on file  Lifestyle  . Physical activity:    Days per week: Not on file    Minutes per session: Not on file  . Stress: Not on file  Relationships  . Social connections:    Talks on phone: Not on file    Gets together: Not on file    Attends religious service: Not on file    Active member of club or organization: Not on file    Attends meetings of clubs or  organizations: Not on file    Relationship status: Not on file  Other Topics Concern  . Not on file  Social History Narrative  . Not on file   Family History: Family History  Problem Relation Age of Onset  . Heart attack Mother    Allergies: No Known Allergies Medications: See med rec.  Review of Systems: No fevers, chills, night sweats, weight loss, chest pain, or shortness of breath.   Objective:    General: Well Developed, well nourished, and in no acute distress.  Neuro: Alert and oriented x3, extra-ocular muscles intact, sensation grossly intact.  HEENT: Normocephalic, atraumatic, pupils equal round reactive to light, neck supple, no masses, no lymphadenopathy, thyroid nonpalpable.  Skin: Warm and dry, erythema over the dorsum of both hands and shins consistent with a sunburn.  There is a crusted lesion over the dorsum of the left hand suspicious for actinic keratosis/squamous cell carcinoma. Cardiac: Regular rate and rhythm, no murmurs rubs or gallops, no lower extremity edema.  Respiratory: Clear to auscultation bilaterally. Not using accessory muscles, speaking in full sentences. Right foot: Good granulation tissue at the site of the toenail avulsion, smooth, intact nail bed.  No signs of bacterial infection.  Impression and Recommendations:    Traumatic right great toenail avulsion Toe looks good after nail plate removal due to avulsion. Good granulation tissue, no signs of infection. He can return to see me as needed for this.  Sunburn Mild sunburn on the hands and legs. Adding topical triamcinolone cream.  Skin lesion There is a suspicious lesion over the dorsum of the hand that appears to be an actinic keratosis versus a squamous cell carcinoma. I have recommended shave biopsy, patient declines, risks, alternatives explained and patient understands.  The patient changed his mind at the end of the visit, he will come tomorrow for shave biopsy.    ___________________________________________ Ihor Austinhomas J. Benjamin Stainhekkekandam, M.D., ABFM., CAQSM. Primary Care and Sports Medicine [facility]  Adjunct Professor of Family Medicine  [employer]

## 2019-02-22 NOTE — Assessment & Plan Note (Signed)
Mild sunburn on the hands and legs. Adding topical triamcinolone cream.

## 2019-02-22 NOTE — Telephone Encounter (Signed)
Physical therapist Sharlet Salina called in regards to at home physical therapy. As per therapist, pt refuses to have physical therapy. Pt informed PT Sharlet Salina "that he feels fine". Physical therapy has stopped referral order. PT wanted provider to be aware of the update. No other inquiries during call.

## 2019-02-22 NOTE — Assessment & Plan Note (Addendum)
There is a suspicious lesion over the dorsum of the hand that appears to be an actinic keratosis versus a squamous cell carcinoma. I have recommended shave biopsy, patient declines, risks, alternatives explained and patient understands.  The patient changed his mind at the end of the visit, he will come tomorrow for shave biopsy.

## 2019-02-23 NOTE — Telephone Encounter (Signed)
Their decision.

## 2019-02-23 NOTE — Telephone Encounter (Signed)
I'd call daughter Doroteo Glassman and let her know he's refusing PT. I've talked abot this with them, he should really be participating in PT to get his strength and energy better.Marland KitchenMarland Kitchen

## 2019-02-23 NOTE — Telephone Encounter (Signed)
Spoke with Pt's daughter. She is aware that Pt is refusing PT. She advised the PT was coming early in the morning, and he won't get up to do the therapy. Questioned if we had therapy moved to the afternoon would he be compliant - she advised he "probably wouldn't." She advises he is up and moving, and walking on his own so we "don't need to waste our time" that he isn't going to do the PT.

## 2019-02-24 ENCOUNTER — Ambulatory Visit (INDEPENDENT_AMBULATORY_CARE_PROVIDER_SITE_OTHER): Payer: Medicare Other | Admitting: Sports Medicine

## 2019-02-24 ENCOUNTER — Encounter: Payer: Self-pay | Admitting: Sports Medicine

## 2019-02-24 DIAGNOSIS — D0462 Carcinoma in situ of skin of left upper limb, including shoulder: Secondary | ICD-10-CM

## 2019-02-24 DIAGNOSIS — L989 Disorder of the skin and subcutaneous tissue, unspecified: Secondary | ICD-10-CM | POA: Diagnosis not present

## 2019-02-24 NOTE — Progress Notes (Signed)
   Procedure:  Excision of left hand 2 cm likely malignant skin lesion Risks, benefits, and alternatives explained and consent obtained. Time out conducted. Surface prepped with alcohol. 2cc lidocaine with epinephine infiltrated in a field block. Adequate anesthesia ensured. Area prepped and draped in a sterile fashion. Excision performed with: Using a derma blade I shaved into the deep dermis, removing the excision in its entirety, I then used a Hyfrecator to achieve hemostasis. Hemostasis achieved. Pt stable.

## 2019-02-24 NOTE — Assessment & Plan Note (Addendum)
Shave biopsy of likely malignant skin lesion on the dorsum of the left hand. Return in 2 weeks for wound check.  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  Keep an eye on it and let me know how things are going.  If he has any more we should remove them as well.

## 2019-02-24 NOTE — Addendum Note (Signed)
Addended by: Arvilla Market on: 02/24/2019 04:27 PM   Modules accepted: Orders

## 2019-03-01 ENCOUNTER — Ambulatory Visit: Payer: Medicare Other | Admitting: Osteopathic Medicine

## 2019-03-09 ENCOUNTER — Encounter: Payer: Self-pay | Admitting: Osteopathic Medicine

## 2019-03-09 ENCOUNTER — Ambulatory Visit (INDEPENDENT_AMBULATORY_CARE_PROVIDER_SITE_OTHER): Payer: Medicare Other | Admitting: Osteopathic Medicine

## 2019-03-09 VITALS — BP 107/61 | HR 89 | Temp 98.2°F | Wt 183.0 lb

## 2019-03-09 DIAGNOSIS — E1165 Type 2 diabetes mellitus with hyperglycemia: Secondary | ICD-10-CM | POA: Diagnosis not present

## 2019-03-09 LAB — POCT GLYCOSYLATED HEMOGLOBIN (HGB A1C): Hemoglobin A1C: 7 % — AB (ref 4.0–5.6)

## 2019-03-09 NOTE — Progress Notes (Signed)
HPI: Matthew Coffey is a 85 y.o. male who  has a past medical history of Diabetes mellitus without complication (HCC), Hyperlipidemia, and Hypertension.  he presents to [facility] today, 03/09/19,  for chief complaint of:  DM2 follow-up  DIABETES SCREENING/PREVENTIVE CARE: A1C past 3-6 mos: Yes  controlled? No   11/2018: 8.4%   03/09/19: 7.0% BP goal <130/80: Yes   BP Readings from Last 3 Encounters:  03/09/19 107/61  02/24/19 121/68  02/22/19 121/70  LDL goal <70: Yes  Eye exam annually: No , importance discussed with patient Foot exam: needs Microalbuminuria: needs checked  Metformin: intolerant ACE/ARB: No - under cardiology care  Antiplatelet if ASCVD Risk >10%: n/a on Plavix, under cardiology care  Statin: Yes  Pneumovax: Yes    Patient has questions about whether or not he should resume Eliquis.  Per my understanding, we were waiting for GI to do a capsule endoscopy, patient states he has not had this done yet and is not sure what GIs plan is.  See phone note, will work on finding this out.    At today's visit 03/09/19 ... PMH, PSH, FH reviewed and updated as needed.  Current medication list and allergy/intolerance hx reviewed and updated as needed. (See remainder of HPI, ROS, Phys Exam below)   No results found.  Results for orders placed or performed in visit on 03/09/19 (from the past 72 hour(s))  POCT HgB A1C     Status: Abnormal   Collection Time: 03/09/19  4:10 PM  Result Value Ref Range   Hemoglobin A1C 7.0 (A) 4.0 - 5.6 %   HbA1c POC (<> result, manual entry)     HbA1c, POC (prediabetic range)     HbA1c, POC (controlled diabetic range)            ASSESSMENT/PLAN: The encounter diagnosis was Uncontrolled type 2 diabetes mellitus with hyperglycemia (HCC).   See phone note regarding GI follow-up questions, patient advised to hold Eliquis until I hear back from GI regarding capsule endoscopy/further  recommendations.  Orders Placed This Encounter  Procedures  . POCT HgB A1C     No orders of the defined types were placed in this encounter.   Patient Instructions  Let me try to contact GI and see what the plan is with this capsule endoscopy/camera pill.  It is not a good idea to restart the Eliquis without being sure that there is no GI bleed.  Please do not restart the Eliquis just yet until you hear something from me or from your GI doctor.   Sugars are looking great, what ever you are doing, keep up the good work!      Follow-up plan: Return in about 3 months (around 06/09/2019) for monitor A1C, see me sooner if needed.                                                 ################################################# ################################################# ################################################# #################################################    Current Meds  Medication Sig  . AMBULATORY NON FORMULARY MEDICATION Single glucometer with lancets, test strips for testing 3x daily.  Use large print meter or a talking meter.  Disp 1 meter. Disp strips and lancets 90 day supply for 3x daily testing. E11.65  . apixaban (ELIQUIS) 5 MG TABS tablet Take 1 tablet (5 mg total) by mouth 2 (two) times daily.  Marland Kitchen  atorvastatin (LIPITOR) 80 MG tablet TAKE 1 TABLET BY MOUTH ONCE DAILY  . Insulin Glargine (TOUJEO MAX SOLOSTAR) 300 UNIT/ML SOPN Inject 40 Units into the skin 2 (two) times daily as needed.  . Insulin Pen Needle 31G X 5 MM MISC Use fresh needle to inject insulin subcutaneously daily. Dx: Insulin Dependent Type 2 Diabetes E11.9, Z79.4  . IRON PO Take by mouth.  . metoprolol succinate (TOPROL-XL) 25 MG 24 hr tablet TAKE 1 TABLET BY MOUTH ONCE DAILY  . Misc. Devices (STEEL ROLLING WALKER) MISC 1 Units by Does not apply route as directed. Dispense rolling walker with seat per patient preference and insurance coverage  dx gait instability, fall risk, debility, CAD, DM2  . pantoprazole (PROTONIX) 40 MG tablet Take by mouth.  . ranolazine (RANEXA) 500 MG 12 hr tablet Take 1 tablet (500 mg total) by mouth 2 (two) times daily.  Marland Kitchen. triamcinolone cream (KENALOG) 0.1 % Apply 1 application topically 2 (two) times daily. Apply to sunburned area on dorsum of hands and legs twice a day.  . Vitamin D, Ergocalciferol, (DRISDOL) 50000 units CAPS capsule TAKE ONE CAPSULE BY MOUTH ONCE EVERY 7 DAYS    No Known Allergies     Review of Systems:  Constitutional: No fever/chills, +fatigue (patient has declined participation with physical therapy)  HEENT: No  headache, no vision change  Cardiac: No  chest pain, No  pressure, No palpitations  Respiratory:  No  shortness of breath. No  Cough  Gastrointestinal: No  abdominal pain, no blood in stool   Musculoskeletal: No new myalgia/arthralgia  Skin: No  Rash  Hem/Onc: No  easy bruising/bleeding, No  abnormal lumps/bumps  Neurologic: No  weakness, No  Dizziness  Psychiatric: No  concerns with depression, No  concerns with anxiety  Exam:  BP 107/61 (BP Location: Left Arm, Patient Position: Sitting, Cuff Size: Normal)   Pulse 89   Temp 98.2 F (36.8 C) (Oral)   Wt 183 lb (83 kg)   BMI 27.83 kg/m   Constitutional: VS see above. General Appearance: alert, well-developed, well-nourished, NAD  Eyes: Normal lids and conjunctive, non-icteric sclera  Respiratory: Normal respiratory effort. no wheeze, no rhonchi, no rales  Cardiovascular: S1/S2 normal, no murmur, no rub/gallop auscultated. RRR.   Musculoskeletal: Gait normal. Symmetric and independent movement of all extremities  Neurological: Normal balance/coordination. No tremor.  Skin: warm, dry, intact.   Psychiatric: Normal judgment/insight. Normal mood and affect. Oriented x3.       Visit summary with medication list and pertinent instructions was printed for patient to review, patient was advised  to alert us if any updates are needed. All questions at time of visit were answered - patient instructed to contact office with any additional concerns. ER/RTC precautions were reviewed with the patient and understanding verbalized.    Please note: voice recognition software was used to produce this document, and typos may escape review. Please contact Dr. Lyn HollingsheadAlexander for any needed clarifications.    Follow up plan: Return in about 3 months (around 06/09/2019) for monitor A1C, see me sooner if needed.

## 2019-03-09 NOTE — Patient Instructions (Signed)
Let me try to contact GI and see what the plan is with this capsule endoscopy/camera pill.  It is not a good idea to restart the Eliquis without being sure that there is no GI bleed.  Please do not restart the Eliquis just yet until you hear something from me or from your GI doctor.   Sugars are looking great, what ever you are doing, keep up the good work!

## 2019-03-10 ENCOUNTER — Telehealth: Payer: Self-pay | Admitting: Osteopathic Medicine

## 2019-03-10 NOTE — Telephone Encounter (Signed)
Matthew Coffey called from GI stating that they are going to call patient next week to schedule the capsule endoscopy. Also for the Eliquis that would be up to PCP or cardiology. Eliquis will not effect the capsule endoscopy.

## 2019-03-10 NOTE — Telephone Encounter (Signed)
And aware that Plavix should not affect the endoscopy, my concern was if they wanted to definitively rule out GI bleed before restarting the Plavix.  Please call patient, let him know that he should be getting a call to schedule the capsule endoscopy, if he feels more comfortable being on the Plavix, he should be okay to take it, but if worsening fatigue or shortness of breath he would need to see me or go to ER right away to check blood counts.

## 2019-03-10 NOTE — Telephone Encounter (Signed)
Called and left message for nurse at [facility] to return call regarding recommendation

## 2019-03-10 NOTE — Telephone Encounter (Signed)
Attempted to contact pt, no answer. Left a brief vm msg for pt to return call back to clinic. Call back info provided.

## 2019-03-10 NOTE — Telephone Encounter (Signed)
Patient had a visit with me yesterday, recent concern for anemia/GI bleed, GI was planning for a capsule endoscopy, patient does not know anything about follow-up.  Can we call GI and see  1. what their plans are for capsule endoscopy, if this is scheduled? 2.  Are they okay with patient resuming Eliquis?    [address]  [address]  [place], [place] [address]  Phone: [phone]

## 2019-03-24 ENCOUNTER — Other Ambulatory Visit: Payer: Self-pay | Admitting: Osteopathic Medicine

## 2019-04-04 ENCOUNTER — Encounter: Payer: Self-pay | Admitting: Osteopathic Medicine

## 2019-04-04 ENCOUNTER — Telehealth: Payer: Self-pay

## 2019-04-04 ENCOUNTER — Other Ambulatory Visit: Payer: Self-pay

## 2019-04-04 ENCOUNTER — Ambulatory Visit (INDEPENDENT_AMBULATORY_CARE_PROVIDER_SITE_OTHER): Payer: Medicare Other | Admitting: Osteopathic Medicine

## 2019-04-04 DIAGNOSIS — Z8719 Personal history of other diseases of the digestive system: Secondary | ICD-10-CM | POA: Diagnosis not present

## 2019-04-04 DIAGNOSIS — Z79899 Other long term (current) drug therapy: Secondary | ICD-10-CM | POA: Diagnosis not present

## 2019-04-04 NOTE — Telephone Encounter (Signed)
Matthew Coffey called - her father is due to have diagnostic testing on Thursday to follow up on bleeding. Pt is reluctant to keep appt. He's making all types of excused to not go to the appt. She also stated that pt has not been taking eliquis medication. Urgently requesting a call from provider to discuss current situation. She is really worried about her father. She can be reached at [phone].

## 2019-04-04 NOTE — Telephone Encounter (Signed)
Called both Kittie and patient. Kittie stated that she would rather have Dr.Alexander speak to patient and not her, because patient will listen to PCP better. No further questions at this time and patient is aware that Dr.Alexander is calling at 4:20pm to discuss this further with him.

## 2019-04-04 NOTE — Telephone Encounter (Signed)
Can we just put this on the schedule as a phone visit at 4:20? Thanks.

## 2019-04-04 NOTE — Progress Notes (Signed)
Virtual Visit via Phone Note  I connected with      Matthew Coffey on 04/04/19 at 5:00 PM by a telemedicine application and verified that I am speaking with the correct person using two identifiers.  Patient is at home I am in office    I discussed the limitations of evaluation and management by telemedicine and the availability of in person appointments. The patient expressed understanding and agreed to proceed.  History of Present Illness: Matthew Coffey is a 85 y.o. male who would like to discuss upcoming procedure   Patient's daughter called the office with concerns that he wanted to stop his blood thinners and he did not want to get the capsule endoscopy.  Patient recently underwent treatment for symptomatic anemia due to GI bleed.  He was on Eliquis for history of pulmonary embolus, no other blood clots per patient and I reviewed records and could not find any other evidence for need for chronic anticoagulation.      Observations/Objective: There were no vitals taken for this visit. BP Readings from Last 3 Encounters:  03/09/19 107/61  02/24/19 121/68  02/22/19 121/70   Exam: Normal Speech.   Lab and Radiology Results No results found for this or any previous visit (from the past 72 hour(s)). No results found.     Assessment and Plan: 85 y.o. male with The primary encounter diagnosis was Medication management. A diagnosis of History of GI bleed was also pertinent to this visit.  Discussion with patient, I think it is fine to go ahead and stop the Eliquis, he does not want to be on this medication is convinced that it alone cause the GI bleed.  I stated that we should still follow-up with capsule endoscopy to rule out any other possible cause of bleed which may have been worsened by, but not necessarily caused by, the blood thinners, such as cancer, ulcer, etc.  Reluctantly agrees to proceed with capsule endoscopy, I encouraged him to keep upcoming appointment with  GI and follow all of specialist instructions.   Follow Up Instructions: No follow-ups on file.    I discussed the assessment and treatment plan with the patient. The patient was provided an opportunity to ask questions and all were answered. The patient agreed with the plan and demonstrated an understanding of the instructions.   The patient was advised to call back or seek an in-person evaluation if any new concerns, if symptoms worsen or if the condition fails to improve as anticipated.  21 minutes of non-face-to-face time was provided during this encounter.                      Historical information moved to improve visibility of documentation.  Past Medical History:  Diagnosis Date  . Diabetes mellitus without complication ([place])   . Hyperlipidemia   . Hypertension    Past Surgical History:  Procedure Laterality Date  . COLECTOMY  07/1999  . kidney removed Right    Social History   Tobacco Use  . Smoking status: Former Smoker    Quit date: 10/12/1973    Years since quitting: 45.5  . Smokeless tobacco: Never Used  Substance Use Topics  . Alcohol use: No   family history includes Heart attack in his mother.  Medications: Current Outpatient Medications  Medication Sig Dispense Refill  . AMBULATORY NON FORMULARY MEDICATION Single glucometer with lancets, test strips for testing 3x daily.  Use large print meter or a talking meter.  Disp 1 meter. Disp strips and lancets 90 day supply for 3x daily testing. E11.65 1 each 0  . apixaban (ELIQUIS) 5 MG TABS tablet Take 1 tablet (5 mg total) by mouth 2 (two) times daily. 180 tablet 1  . atorvastatin (LIPITOR) 80 MG tablet TAKE 1 TABLET BY MOUTH ONCE DAILY 90 tablet 3  . Insulin Glargine (TOUJEO MAX SOLOSTAR) 300 UNIT/ML SOPN Inject 40 Units into the skin 2 (two) times daily as needed. 24 mL 3  . Insulin Pen Needle 31G X 5 MM MISC Use fresh needle to inject insulin subcutaneously daily. Dx: Insulin Dependent Type  2 Diabetes E11.9, Z79.4 100 each 99  . IRON PO Take by mouth.    . isosorbide mononitrate (IMDUR) 30 MG 24 hr tablet Take 2 tablets (60 mg total) by mouth daily. 90 tablet 3  . metoprolol succinate (TOPROL-XL) 25 MG 24 hr tablet TAKE 1 TABLET BY MOUTH ONCE DAILY 90 tablet 3  . Misc. Devices (STEEL ROLLING WALKER) MISC 1 Units by Does not apply route as directed. Dispense rolling walker with seat per patient preference and insurance coverage dx gait instability, fall risk, debility, CAD, DM2 1 each 1  . nitroGLYCERIN (NITROSTAT) 0.4 MG SL tablet Place 1 tablet (0.4 mg total) under the tongue as needed for chest pain. 15 tablet 5  . pantoprazole (PROTONIX) 40 MG tablet Take by mouth.    . ranolazine (RANEXA) 500 MG 12 hr tablet Take 1 tablet by mouth twice daily 180 tablet 1  . triamcinolone cream (KENALOG) 0.1 % Apply 1 application topically 2 (two) times daily. Apply to sunburned area on dorsum of hands and legs twice a day. 45 g 0  . Vitamin D, Ergocalciferol, (DRISDOL) 50000 units CAPS capsule TAKE ONE CAPSULE BY MOUTH ONCE EVERY 7 DAYS 12 capsule 3   No current facility-administered medications for this visit.    No Known Allergies  PDMP not reviewed this encounter. No orders of the defined types were placed in this encounter.  No orders of the defined types were placed in this encounter.

## 2019-04-19 ENCOUNTER — Encounter: Payer: Self-pay | Admitting: Osteopathic Medicine

## 2019-04-19 ENCOUNTER — Ambulatory Visit (INDEPENDENT_AMBULATORY_CARE_PROVIDER_SITE_OTHER): Payer: Medicare Other | Admitting: Osteopathic Medicine

## 2019-04-19 ENCOUNTER — Other Ambulatory Visit: Payer: Self-pay

## 2019-04-19 VITALS — BP 125/59 | HR 76 | Temp 97.7°F | Wt 188.7 lb

## 2019-04-19 DIAGNOSIS — M7989 Other specified soft tissue disorders: Secondary | ICD-10-CM

## 2019-04-19 MED ORDER — AMBULATORY NON FORMULARY MEDICATION: 99 refills | Status: AC

## 2019-04-19 MED ORDER — FUROSEMIDE 40 MG PO TABS: 40.0000 mg | ORAL_TABLET | Freq: Two times a day (BID) | ORAL | 0 refills | Status: AC

## 2019-04-19 NOTE — Progress Notes (Signed)
HPI: Matthew Coffey is a 85 y.o. male who  has a past medical history of Diabetes mellitus without complication (HCC), Hyperlipidemia, and Hypertension.  [facility], [facility]/19/19,  for chief complaint of:  Lower extremity swelling   Swelling always comes and goes but it a little worse today. WOuld like refill of fluid pills. No CP/SOB, no orthopnea, no calf pain, some associated soreness medial R ankle         At today's visit 04/19/19 ... PMH, PSH, FH reviewed and updated as needed.  Current medication list and allergy/intolerance hx reviewed and updated as needed. (See remainder of HPI, ROS, Phys Exam below)           ASSESSMENT/PLAN: The encounter diagnosis was Swelling of lower extremity.   Possible may-thurner would explain the asymmetry, neg Homan's, will see if he responds to diuretics, precautions reviewed for ER      Meds ordered this encounter  Medications  . furosemide (LASIX) 40 MG tablet    Sig: Take 1 tablet (40 mg total) by mouth 2 (two) times daily. As needed for swelling.    Dispense:  30 tablet    Refill:  0  . AMBULATORY NON FORMULARY MEDICATION    Sig: Compression hose / stocking as needed, please measure for appropriate size for patient    Dispense:  20 Units    Refill:  prn        Follow-up plan: Return for KEEP CURRENTLY SCHEDULED APPOINTMENT, OR SEE ME SOONER IF NEEDED .                                                 ################################################# ################################################# ################################################# #################################################    Current Meds  Medication Sig  . AMBULATORY NON FORMULARY MEDICATION Single glucometer with lancets, test strips for testing 3x daily.  Use large print meter or a talking meter.  Disp 1 meter. Disp strips and lancets  90 day supply for 3x daily testing. E11.65  . atorvastatin (LIPITOR) 80 MG tablet TAKE 1 TABLET BY MOUTH ONCE DAILY  . Insulin Glargine (TOUJEO MAX SOLOSTAR) 300 UNIT/ML SOPN Inject 40 Units into the skin 2 (two) times daily as needed.  . Insulin Pen Needle 31G X 5 MM MISC Use fresh needle to inject insulin subcutaneously daily. Dx: Insulin Dependent Type 2 Diabetes E11.9, Z79.4  . IRON PO Take by mouth.  . metoprolol succinate (TOPROL-XL) 25 MG 24 hr tablet TAKE 1 TABLET BY MOUTH ONCE DAILY  . Misc. Devices (STEEL ROLLING WALKER) MISC 1 Units by Does not apply route as directed. Dispense rolling walker with seat per patient preference and insurance coverage dx gait instability, fall risk, debility, CAD, DM2  . pantoprazole (PROTONIX) 40 MG tablet Take by mouth.  . ranolazine (RANEXA) 500 MG 12 hr tablet Take 1 tablet by mouth twice daily  . triamcinolone cream (KENALOG) 0.1 % Apply 1 application topically 2 (two) times daily. Apply to sunburned area on dorsum of hands and legs twice a day.  . Vitamin D, Ergocalciferol, (DRISDOL) 50000 units CAPS capsule TAKE ONE CAPSULE BY MOUTH ONCE EVERY 7 DAYS    No Known Allergies     Review of Systems:  Constitutional: No recent illness  Cardiac: No  chest pain, No  pressure, No palpitations  Respiratory:  No  shortness of  breath. No  Cough  Musculoskeletal: No new myalgia/arthralgia  Skin: No  Rash  Neurologic: No  weakness, No  Dizziness   Exam:  BP (!) 125/59 (BP Location: Left Arm, Patient Position: Sitting, Cuff Size: Normal)   Pulse 76   Temp 97.7 F (36.5 C) (Oral)   Wt 188 lb 11.2 oz (85.6 kg)   BMI 28.69 kg/m   Constitutional: VS see above. General Appearance: alert, well-developed, well-nourished, NAD  Respiratory: Normal respiratory effort.   Cardiovascular: S1/S2 normal, significant LE edema on R about 2+, less on L about trace-1+  Musculoskeletal: Gait normal. Symmetric and independent movement of all extremities   Psychiatric: Normal judgment/insight. Normal mood and affect. Oriented x3.       Visit summary with medication list and pertinent instructions was printed for patient to review, patient was advised to alert [place] if any updates are needed. All questions at time of visit were answered - patient instructed to contact office with any additional concerns. ER/RTC precautions were reviewed with the patient and understanding verbalized.   Please note: voice recognition software was used to produce this document, and typos may escape review. Please contact Dr. Sheppard Coil for any needed clarifications.    Follow up plan: No follow-ups on file.

## 2019-05-09 ENCOUNTER — Telehealth: Payer: Self-pay | Admitting: *Deleted

## 2019-05-09 NOTE — Telephone Encounter (Signed)
Left voicemail with information below. Let patient know to call us back to schedule a virtual visit. °

## 2019-05-09 NOTE — Telephone Encounter (Signed)
Matthew Coffey can you schedule this patient for a virtual in [facility] on Thursday for returning shortness of breath?  She said that if he can't wait that long then offer Urgent Care.

## 2019-05-11 ENCOUNTER — Ambulatory Visit (HOSPITAL_COMMUNITY)
Admission: RE | Admit: 2019-05-11 | Discharge: 2019-05-11 | Disposition: A | Discharge status: Home / Self Care | Payer: Medicare Other | Source: Ambulatory Visit | Attending: Osteopathic Medicine | Admitting: Osteopathic Medicine

## 2019-05-11 ENCOUNTER — Ambulatory Visit (INDEPENDENT_AMBULATORY_CARE_PROVIDER_SITE_OTHER): Payer: Medicare Other | Admitting: Osteopathic Medicine

## 2019-05-11 ENCOUNTER — Ambulatory Visit (INDEPENDENT_AMBULATORY_CARE_PROVIDER_SITE_OTHER): Payer: Medicare Other

## 2019-05-11 ENCOUNTER — Encounter: Payer: Self-pay | Admitting: Osteopathic Medicine

## 2019-05-11 ENCOUNTER — Other Ambulatory Visit: Payer: Self-pay

## 2019-05-11 DIAGNOSIS — M7989 Other specified soft tissue disorders: Secondary | ICD-10-CM | POA: Diagnosis not present

## 2019-05-11 DIAGNOSIS — R5382 Chronic fatigue, unspecified: Secondary | ICD-10-CM | POA: Diagnosis not present

## 2019-05-11 DIAGNOSIS — R0602 Shortness of breath: Secondary | ICD-10-CM | POA: Insufficient documentation

## 2019-05-11 DIAGNOSIS — Z8719 Personal history of other diseases of the digestive system: Secondary | ICD-10-CM

## 2019-05-11 LAB — CBC WITH DIFFERENTIAL/PLATELET
Absolute Monocytes: 419 cells/uL (ref 200–950)
Basophils Absolute: 32 cells/uL (ref 0–200)
Basophils Relative: 0.7 %
Eosinophils Absolute: 180 cells/uL (ref 15–500)
Eosinophils Relative: 4 %
HCT: 38.9 % (ref 38.5–50.0)
Hemoglobin: 12.6 g/dL — ABNORMAL LOW (ref 13.2–17.1)
Lymphs Abs: 1233 cells/uL (ref 850–3900)
MCH: 27.6 pg (ref 27.0–33.0)
MCHC: 32.4 g/dL (ref 32.0–36.0)
MCV: 85.3 fL (ref 80.0–100.0)
MPV: 12.1 fL (ref 7.5–12.5)
Monocytes Relative: 9.3 %
Neutro Abs: 2637 cells/uL (ref 1500–7800)
Neutrophils Relative %: 58.6 %
Platelets: 133 10*3/uL — ABNORMAL LOW (ref 140–400)
RBC: 4.56 10*6/uL (ref 4.20–5.80)
RDW: 17.4 % — ABNORMAL HIGH (ref 11.0–15.0)
Total Lymphocyte: 27.4 %
WBC: 4.5 10*3/uL (ref 3.8–10.8)

## 2019-05-11 LAB — COMPLETE METABOLIC PANEL WITH GFR
AG Ratio: 1.5 (calc) (ref 1.0–2.5)
ALT: 17 U/L (ref 9–46)
AST: 18 U/L (ref 10–35)
Albumin: 3.5 g/dL — ABNORMAL LOW (ref 3.6–5.1)
Alkaline phosphatase (APISO): 38 U/L (ref 35–144)
BUN/Creatinine Ratio: 15 (calc) (ref 6–22)
BUN: 23 mg/dL (ref 7–25)
CO2: 27 mmol/L (ref 20–32)
Calcium: 9.2 mg/dL (ref 8.6–10.3)
Chloride: 105 mmol/L (ref 98–110)
Creat: 1.55 mg/dL — ABNORMAL HIGH (ref 0.70–1.11)
GFR, Est African American: 46 mL/min/{1.73_m2} — ABNORMAL LOW (ref >=60)
GFR, Est Non African American: 40 mL/min/{1.73_m2} — ABNORMAL LOW (ref >=60)
Globulin: 2.3 g/dL (calc) (ref 1.9–3.7)
Glucose, Bld: 250 mg/dL — ABNORMAL HIGH (ref 65–99)
Potassium: 4.6 mmol/L (ref 3.5–5.3)
Sodium: 137 mmol/L (ref 135–146)
Total Bilirubin: 1 mg/dL (ref 0.2–1.2)
Total Protein: 5.8 g/dL — ABNORMAL LOW (ref 6.1–8.1)

## 2019-05-11 LAB — POCT I-STAT CREATININE: Creatinine, Ser: 1.6 mg/dL — ABNORMAL HIGH (ref 0.61–1.24)

## 2019-05-11 LAB — IRON,TIBC AND FERRITIN PANEL
%SAT: 30 % (calc) (ref 20–48)
Ferritin: 14 ng/mL — ABNORMAL LOW (ref 24–380)
Iron: 83 ug/dL (ref 50–180)
TIBC: 276 mcg/dL (calc) (ref 250–425)

## 2019-05-11 LAB — URINALYSIS, ROUTINE W REFLEX MICROSCOPIC
Bilirubin Urine: NEGATIVE
Hgb urine dipstick: NEGATIVE
Ketones, ur: NEGATIVE
Leukocytes,Ua: NEGATIVE
Nitrite: NEGATIVE
Protein, ur: NEGATIVE
Specific Gravity, Urine: 1.018 (ref 1.001–1.03)
pH: 5.5 (ref 5.0–8.0)

## 2019-05-11 MED ORDER — SODIUM CHLORIDE (PF) 0.9 % IJ SOLN
INTRAMUSCULAR | Status: AC
  Filled 2019-05-11: qty 50

## 2019-05-11 MED ORDER — IOHEXOL 350 MG/ML SOLN
100.0000 mL | Freq: Once | INTRAVENOUS | Status: AC | PRN
  Administered 2019-05-11: 100 mL via INTRAVENOUS

## 2019-05-11 NOTE — Addendum Note (Signed)
Addended by: Towana Badger on: 05/11/2019 01:43 PM   Modules accepted: Orders

## 2019-05-11 NOTE — Progress Notes (Addendum)
Virtual Visit via Phone Note  I connected with      Matthew PalmerClaude Fiorella on 05/11/19 at 10:10 AM by a telemedicine application and verified that I am speaking with the correct person using two identifiers.  Patient is at home I am in office    I discussed the limitations of evaluation and management by telemedicine and the availability of in person appointments. The patient expressed understanding and agreed to proceed.  History of Present Illness: Matthew Coffey is a 85 y.o. male who would like to discuss - wants blood work    Would like blood checked to see if he might have a blood clot in the lungs. He does have history of PE 10/2017. Reports dyspnea on exertion. Stopped blood thinners few months ago after GI bleed, I thought this reasonable since had been on them >1 year from PE. Had been Has persistently declined camera pill endoscopy, doesn't think he needs it despite my insistence. Drinking a lot of water lately. Reports "I don't have trouble breathing" but states he is getting short of breath with his usual activities.        Observations/Objective: There were no vitals taken for this visit. BP Readings from Last 3 Encounters:  04/19/19 (!) 125/59  03/09/19 107/61  02/24/19 121/68   Exam: Normal Speech.  NAD  Lab and Radiology Results No results found for this or any previous visit (from the past 72 hour(s)). No results found.     Assessment and Plan: 85 y.o. male with The primary encounter diagnosis was Shortness of breath. Diagnoses of Chronic fatigue, Swelling of lower extremity, and History of GI bleed were also pertinent to this visit.  Patient states that his symptoms have been stable for several days, does not want to go to the emergency room.  I think this is reasonable but we definitely need some work-up with labs, x-ray, will try to get him set up for CT PE protocol once we have blood work back.  He certainly has other reasons for potential short of breath,  patient was advised that if his condition worsens he needs to seek emergency medical care and he is okay with this plan.  PDMP not reviewed this encounter. Orders Placed This Encounter  Procedures  . DG Chest 2 View    Order Specific Question:   Reason for Exam (SYMPTOM  OR DIAGNOSIS REQUIRED)    Answer:   SOB    Order Specific Question:   Preferred imaging location?    [facility]  . CT ANGIO CHEST PE W OR WO CONTRAST    Order Specific Question:   If indicated for the ordered procedure, I authorize the administration of contrast media per Radiology protocol    Answer:   Yes    Order Specific Question:   Preferred imaging location?    [facility]    Order Specific Question:   Call Results- Best Contact Number?    Answer:   Dr Lyn HollingsheadAlexander cell [phone] or if I can't answer, provider on call [phone]    Order Specific Question:   Radiology Contrast Protocol - do NOT remove file path    Answer:   \\[email or web address].pdf  . CBC with Differential/Platelet  . COMPLETE METABOLIC PANEL WITH GFR  . Urinalysis, Routine w reflex microscopic  . Fe+TIBC+Fer   No orders of the defined types were placed in this encounter.  There are no Patient Instructions on file for this visit.  Instructions sent via MyChart. If MyChart not available, pt was given option for info via personal e-mail w/ no guarantee of protected health info over unsecured e-mail communication, and MyChart sign-up instructions were included.   Follow Up Instructions: Return for RECHECK BREATHING PENDING RESULTS / IF WORSE OR CHANGE.    I discussed the assessment and treatment plan with the patient. The patient was provided an opportunity to ask questions and all were answered. The patient agreed with the plan and demonstrated an understanding of the instructions.   The patient was advised to call back or seek an in-person evaluation if any new concerns, if  symptoms worsen or if the condition fails to improve as anticipated.  25 minutes of non-face-to-face time was provided during this encounter.                      Historical information moved to improve visibility of documentation.  Past Medical History:  Diagnosis Date  . Diabetes mellitus without complication (HCC)   . Hyperlipidemia   . Hypertension    Past Surgical History:  Procedure Laterality Date  . COLECTOMY  07/1999  . kidney removed Right    Social History   Tobacco Use  . Smoking status: Former Smoker    Quit date: 10/12/1973    Years since quitting: 45.6  . Smokeless tobacco: Never Used  Substance Use Topics  . Alcohol use: No   family history includes Heart attack in his mother.  Medications: Current Outpatient Medications  Medication Sig Dispense Refill  . AMBULATORY NON FORMULARY MEDICATION Single glucometer with lancets, test strips for testing 3x daily.  Use large print meter or a talking meter.  Disp 1 meter. Disp strips and lancets 90 day supply for 3x daily testing. E11.65 1 each 0  . AMBULATORY NON FORMULARY MEDICATION Compression hose / stocking as needed, please measure for appropriate size for patient 20 Units prn  . atorvastatin (LIPITOR) 80 MG tablet TAKE 1 TABLET BY MOUTH ONCE DAILY 90 tablet 3  . furosemide (LASIX) 40 MG tablet Take 1 tablet (40 mg total) by mouth 2 (two) times daily. As needed for swelling. 30 tablet 0  . Insulin Glargine (TOUJEO MAX SOLOSTAR) 300 UNIT/ML SOPN Inject 40 Units into the skin 2 (two) times daily as needed. 24 mL 3  . Insulin Pen Needle 31G X 5 MM MISC Use fresh needle to inject insulin subcutaneously daily. Dx: Insulin Dependent Type 2 Diabetes E11.9, Z79.4 100 each 99  . IRON PO Take by mouth.    . metoprolol succinate (TOPROL-XL) 25 MG 24 hr tablet TAKE 1 TABLET BY MOUTH ONCE DAILY 90 tablet 3  . Misc. Devices (STEEL ROLLING WALKER) MISC 1 Units by Does not apply route as directed. Dispense  rolling walker with seat per patient preference and insurance coverage dx gait instability, fall risk, debility, CAD, DM2 1 each 1  . pantoprazole (PROTONIX) 40 MG tablet Take by mouth.    . ranolazine (RANEXA) 500 MG 12 hr tablet Take 1 tablet by mouth twice daily 180 tablet 1  . triamcinolone cream (KENALOG) 0.1 % Apply 1 application topically 2 (two) times daily. Apply to sunburned area on dorsum of hands and legs twice a day. 45 g 0  . Vitamin D, Ergocalciferol, (DRISDOL) 50000 units CAPS capsule TAKE ONE CAPSULE BY MOUTH ONCE EVERY 7 DAYS 12 capsule 3  . isosorbide mononitrate (IMDUR) 30 MG 24 hr tablet Take 2 tablets (60 mg total) by mouth daily.  90 tablet 3  . nitroGLYCERIN (NITROSTAT) 0.4 MG SL tablet Place 1 tablet (0.4 mg total) under the tongue as needed for chest pain. 15 tablet 5   No current facility-administered medications for this visit.    No Known Allergies  PDMP not reviewed this encounter. Orders Placed This Encounter  Procedures  . DG Chest 2 View    Order Specific Question:   Reason for Exam (SYMPTOM  OR DIAGNOSIS REQUIRED)    Answer:   SOB    Order Specific Question:   Preferred imaging location?    Answer:   [facility]  . CT ANGIO CHEST PE W OR WO CONTRAST    Order Specific Question:   If indicated for the ordered procedure, I authorize the administration of contrast media per Radiology protocol    Answer:   Yes    Order Specific Question:   Preferred imaging location?    Answer:   [facility] Specific Question:   Call Results- Best Contact Number?    Answer:   Dr Sheppard Coil cell [phone] or if I can't answer, provider on call [phone]    Order Specific Question:   Radiology Contrast Protocol - do NOT remove file path    Answer:   \\[email or web address]\Radiant\CTProtocols.pdf  . CBC with Differential/Platelet  . COMPLETE METABOLIC PANEL WITH GFR  . Urinalysis, Routine w reflex microscopic  . Fe+TIBC+Fer   No orders of the  defined types were placed in this encounter.

## 2019-05-11 NOTE — Addendum Note (Signed)
Addended by: Maryla Morrow on: 05/11/2019 10:50 AM   Modules accepted: Orders

## 2019-05-19 ENCOUNTER — Other Ambulatory Visit: Payer: Self-pay | Admitting: Family Medicine

## 2019-06-08 ENCOUNTER — Ambulatory Visit: Payer: Medicare Other | Admitting: Osteopathic Medicine

## 2019-06-09 ENCOUNTER — Ambulatory Visit (INDEPENDENT_AMBULATORY_CARE_PROVIDER_SITE_OTHER): Payer: Medicare Other | Admitting: Osteopathic Medicine

## 2019-06-09 ENCOUNTER — Encounter: Payer: Self-pay | Admitting: Osteopathic Medicine

## 2019-06-09 ENCOUNTER — Other Ambulatory Visit: Payer: Self-pay

## 2019-06-09 VITALS — BP 118/57 | HR 96 | Temp 98.2°F | Wt 178.8 lb

## 2019-06-09 DIAGNOSIS — Z23 Encounter for immunization: Secondary | ICD-10-CM

## 2019-06-09 DIAGNOSIS — Z794 Long term (current) use of insulin: Secondary | ICD-10-CM

## 2019-06-09 DIAGNOSIS — E1169 Type 2 diabetes mellitus with other specified complication: Secondary | ICD-10-CM | POA: Diagnosis not present

## 2019-06-09 LAB — POCT GLYCOSYLATED HEMOGLOBIN (HGB A1C): Hemoglobin A1C: 7.2 % — AB (ref 4.0–5.6)

## 2019-06-09 MED ORDER — TOUJEO MAX SOLOSTAR 300 UNIT/ML ~~LOC~~ SOPN: 70.0000 [IU] | PEN_INJECTOR | Freq: Every day | SUBCUTANEOUS | 99 refills | Status: DC

## 2019-06-09 NOTE — Progress Notes (Signed)
HPI: Mila PalmerClaude Brayman is a 85 y.o. male who  has a past medical history of Diabetes mellitus without complication (HCC), Hyperlipidemia, and Hypertension.  he presents to [facility] today, 06/09/19,  for chief complaint of:  Diabetes follow-up   DIABETES CARE: A1C past 3-6 mos: Yes     11/2018: 8.4%   03/09/19: 7.0%  Today 06/09/19: 7.2%   BP goal <130/80: Yes   BP Readings from Last 3 Encounters:  06/09/19 (!) 118/57  04/19/19 (!) 125/59  03/09/19 107/61   LDL goal <70: Yes  Eye exam annually: No , importance discussed with patient Foot exam: needs Microalbuminuria: needs checked  Metformin: intolerant ACE/ARB: No - under cardiology care  Antiplatelet if ASCVD Risk >10%: n/a on Plavix, under cardiology care  Statin: Yes  Pneumovax: Yes              At today's visit 06/09/19 ... PMH, PSH, FH reviewed and updated as needed.  Current medication list and allergy/intolerance hx reviewed and updated as needed. (See remainder of HPI, ROS, Phys Exam below)   No results found.  No results found for this or any previous visit (from the past 72 hour(s)).        ASSESSMENT/PLAN: The primary encounter diagnosis was Type 2 diabetes mellitus with other specified complication, with long-term current use of insulin (HCC). A diagnosis of Need for influenza vaccination was also pertinent to this visit.  We also spent a good bit of time talking about his wife, he is struggling at times to take care of her given her dementia.  Her moods have changed for the worse, she is often irritable and snaps at him.  He is doing his best to take care of her, he does have some help from family but gets frustrated when they leave on vacation and cannot come around to check up on them.  He is very reluctant to place her in any kind of facility, reluctant to discuss hospice, will revisit   Blood pressure a bit on the low side, patient not really able to  confirm the medications he is taking.  He is taking Toujeo 40 units twice daily, is not checking blood sugars but occasionally is having some symptoms of moments of fatigue where he thinks his blood sugars might be dropping.  Will adjust medications  Orders Placed This Encounter  Procedures  . Flu Vaccine QUAD High Dose(Fluad)  . POCT HgB A1C     Meds ordered this encounter  Medications  . Insulin Glargine, 2 Unit Dial, (TOUJEO MAX SOLOSTAR) 300 UNIT/ML SOPN    Sig: Inject 70 Units into the skin daily.    Dispense:  24 mL    Refill:  99    Patient Instructions  Will change Toujeo insulin to 70 units ONCE per day.       Follow-up plan: Return in about 3 months (around 09/09/2019) for follow-up diabetes & A1c. In office ok..                                                 ################################################# ################################################# ################################################# #################################################    Current Meds  Medication Sig  . AMBULATORY NON FORMULARY MEDICATION Single glucometer with lancets, test strips for testing 3x daily.  Use large print meter or a talking meter.  Disp 1 meter. Disp strips and  lancets 90 day supply for 3x daily testing. E11.65  . AMBULATORY NON FORMULARY MEDICATION Compression hose / stocking as needed, please measure for appropriate size for patient  . atorvastatin (LIPITOR) 80 MG tablet TAKE 1 TABLET BY MOUTH ONCE DAILY  . furosemide (LASIX) 40 MG tablet Take 1 tablet (40 mg total) by mouth 2 (two) times daily. As needed for swelling.  . Insulin Pen Needle 31G X 5 MM MISC Use fresh needle to inject insulin subcutaneously daily. Dx: Insulin Dependent Type 2 Diabetes E11.9, Z79.4  . IRON PO Take by mouth.  . metoprolol succinate (TOPROL-XL) 25 MG 24 hr tablet TAKE 1 TABLET BY MOUTH ONCE DAILY  . Misc. Devices (STEEL ROLLING  WALKER) MISC 1 Units by Does not apply route as directed. Dispense rolling walker with seat per patient preference and insurance coverage dx gait instability, fall risk, debility, CAD, DM2  . pantoprazole (PROTONIX) 40 MG tablet Take by mouth.  . ranolazine (RANEXA) 500 MG 12 hr tablet Take 1 tablet by mouth twice daily  . TOUJEO MAX SOLOSTAR 300 UNIT/ML SOPN INJECT 40 UNITS SUBCUTANEOUSLY TWICE DAILY AS NEEDED  . triamcinolone cream (KENALOG) 0.1 % Apply 1 application topically 2 (two) times daily. Apply to sunburned area on dorsum of hands and legs twice a day.  . Vitamin D, Ergocalciferol, (DRISDOL) 50000 units CAPS capsule TAKE ONE CAPSULE BY MOUTH ONCE EVERY 7 DAYS    No Known Allergies     Review of Systems:  Constitutional: No recent illness  HEENT: No  headache, no vision change  Cardiac: No  chest pain, No  pressure, No palpitations  Respiratory:  +stable shortness of breath. +stable occasional Cough  Gastrointestinal: No  abdominal pain  Musculoskeletal: No new myalgia/arthralgia  Neurologic: No  weakness, No  Dizziness  Psychiatric: No  concerns with depression, No  concerns with anxiety  Exam:  BP (!) 118/57 (BP Location: Left Arm, Patient Position: Sitting, Cuff Size: Normal)   Pulse 96   Temp 98.2 F (36.8 C) (Oral)   Wt 178 lb 12.8 oz (81.1 kg)   BMI 27.19 kg/m   Constitutional: VS see above. General Appearance: alert, well-developed, well-nourished, NAD  Eyes: Normal lids and conjunctive, non-icteric sclera  Neck: No masses, trachea midline.   Respiratory: Normal respiratory effort. no wheeze, no rhonchi, no rales  Cardiovascular: S1/S2 normal, no murmur, no rub/gallop auscultated. RRR.   Musculoskeletal: Gait normal. Symmetric and independent movement of all extremities  Neurological: Normal balance/coordination. No tremor.  Skin: warm, dry, intact.   Psychiatric: Normal judgment/insight. Normal mood and affect. Oriented x3.       Visit  summary with medication list and pertinent instructions was printed for patient to review, patient was advised to alert [place] if any updates are needed. All questions at time of visit were answered - patient instructed to contact office with any additional concerns. ER/RTC precautions were reviewed with the patient and understanding verbalized.   Note: Total time spent 25 minutes, greater than 50% of the visit was spent face-to-face counseling and coordinating care for the following: The primary encounter diagnosis was Uncontrolled type 2 diabetes mellitus with hyperglycemia ([place]). A diagnosis of Need for influenza vaccination was also pertinent to this visit.Marland Kitchen  Please note: voice recognition software was used to produce this document, and typos may escape review. Please contact Dr. Sheppard Coil for any needed clarifications.    Follow up plan: Return in about 3 months (around 09/09/2019) for follow-up diabetes & A1c. In office ok.Marland Kitchen

## 2019-06-09 NOTE — Patient Instructions (Signed)
Will change Toujeo insulin to 70 units ONCE per day.

## 2019-07-24 ENCOUNTER — Other Ambulatory Visit: Payer: Self-pay | Admitting: Osteopathic Medicine

## 2019-07-24 DIAGNOSIS — I10 Essential (primary) hypertension: Secondary | ICD-10-CM

## 2019-09-03 ENCOUNTER — Other Ambulatory Visit: Payer: Self-pay | Admitting: Osteopathic Medicine

## 2019-09-11 ENCOUNTER — Other Ambulatory Visit: Payer: Self-pay

## 2019-09-11 ENCOUNTER — Ambulatory Visit (INDEPENDENT_AMBULATORY_CARE_PROVIDER_SITE_OTHER): Payer: Medicare Other | Admitting: Osteopathic Medicine

## 2019-09-11 ENCOUNTER — Encounter: Payer: Self-pay | Admitting: Osteopathic Medicine

## 2019-09-11 VITALS — BP 112/66 | HR 74 | Temp 97.8°F | Wt 186.1 lb

## 2019-09-11 DIAGNOSIS — E1169 Type 2 diabetes mellitus with other specified complication: Secondary | ICD-10-CM | POA: Diagnosis not present

## 2019-09-11 DIAGNOSIS — Z794 Long term (current) use of insulin: Secondary | ICD-10-CM | POA: Diagnosis not present

## 2019-09-11 LAB — POCT GLYCOSYLATED HEMOGLOBIN (HGB A1C): Hemoglobin A1C: 6.9 % — AB (ref 4.0–5.6)

## 2019-09-11 MED ORDER — TOUJEO MAX SOLOSTAR 300 UNIT/ML ~~LOC~~ SOPN: 60.0000 [IU] | PEN_INJECTOR | Freq: Every day | SUBCUTANEOUS | 11 refills | Status: AC

## 2019-09-11 NOTE — Patient Instructions (Signed)
Let's take 60 units of insulin every day, NOT 70 units!

## 2019-09-11 NOTE — Progress Notes (Signed)
HPI: Matthew Coffey is a 85 y.o. male who  has a past medical history of Diabetes mellitus without complication ([place]), Hyperlipidemia, and Hypertension.  he presents to [facility] today, 09/11/19,  for chief complaint of:  DM2 follow-up   DIABETES CARE: A1C past 3-6 mos:Yes  11/2018: 8.4%  03/09/19: 7.0%  06/09/19: 7.2%   Today 09/11/19:   BP goal <130/80:Yes BP Readings from Last 3 Encounters:  06/09/19 (!) 118/57  04/19/19 (!) 125/59  03/09/19 107/61   LDL goal <70:Yes Eye exam annually:No, importance discussed with patient Foot exam:needs Microalbuminuria: neg protein 04/2019 needs official microalbumin check  Metformin:intolerant ACE/ARB:No - under cardiology care Antiplatelet if ASCVD Risk >10%:n/a on Plavix, under cardiology care Statin:Yes Pneumovax:Yes       At today's visit 09/11/19 ... PMH, PSH, FH reviewed and updated as needed.  Current medication list and allergy/intolerance hx reviewed and updated as needed. (See remainder of HPI, ROS, Phys Exam below)   No results found.  No results found for this or any previous visit (from the past 72 hour(s)).        ASSESSMENT/PLAN: The encounter diagnosis was Type 2 diabetes mellitus with other specified complication, with long-term current use of insulin ([place]).   Doing ok with death of his wife recently, good family support though they are annoying him a bit :)  Fatigue, better w/ food, I think we can decrease insulin a bit   Orders Placed This Encounter  Procedures  . POCT HgB A1C     Meds ordered this encounter  Medications  . Insulin Glargine, 2 Unit Dial, (TOUJEO MAX SOLOSTAR) 300 UNIT/ML SOPN    Sig: Inject 60 Units into the skin daily.    Dispense:  12 mL    Refill:  11    Patient Instructions  Let's take 60 units of insulin every day, NOT 70 units!       Follow-up plan: Return in about 3 months (around 12/10/2019)  for recheck diabetes.                                                 ################################################# ################################################# ################################################# #################################################    No outpatient medications have been marked as taking for the 09/11/19 encounter (Appointment) with Emeterio Reeve, DO.    No Known Allergies     Review of Systems:  Constitutional: No recent illness  HEENT: No  headache, no vision change  Cardiac: No  chest pain, No  pressure, No palpitations  Respiratory:  No  shortness of breath. No  Cough  Gastrointestinal: No  abdominal pain, no change on bowel habits  Neurologic: No  weakness, No  Dizziness  Psychiatric: +concerns with depression, No  concerns with anxiety  Exam:  BP 112/66 (BP Location: Right Arm, Patient Position: Sitting, Cuff Size: Normal)   Pulse 74   Temp 97.8 F (36.6 C) (Oral)   Wt 186 lb 1.3 oz (84.4 kg)   BMI 28.29 kg/m   Constitutional: VS see above. General Appearance: alert, well-developed, well-nourished, NAD  Eyes: Normal lids and conjunctive, non-icteric sclera  Neck: No masses, trachea midline.   Respiratory: Normal respiratory effort.   Musculoskeletal: Gait normal. Symmetric and independent movement of all extremities  Neurological: Normal balance/coordination. No tremor.  Skin: warm, dry, intact.   Psychiatric: Normal judgment/insight. Normal mood and affect. Oriented x3.  Visit summary with medication list and pertinent instructions was printed for patient to review, patient was advised to alert [place] if any updates are needed. All questions at time of visit were answered - patient instructed to contact office with any additional concerns. ER/RTC precautions were reviewed with the patient and understanding verbalized.   Note: Total time spent 25 minutes,  greater than 50% of the visit was spent face-to-face counseling and coordinating care for the following: The encounter diagnosis was Type 2 diabetes mellitus with other specified complication, with long-term current use of insulin (HCC)..  Please note: voice recognition software was used to produce this document, and typos may escape review. Please contact Dr. Lyn Hollingshead for any needed clarifications.    Follow up plan: Return in about 3 months (around 12/10/2019) for recheck diabetes.

## 2019-09-13 ENCOUNTER — Other Ambulatory Visit: Payer: Self-pay | Admitting: Osteopathic Medicine

## 2019-09-13 DIAGNOSIS — I10 Essential (primary) hypertension: Secondary | ICD-10-CM

## 2019-09-28 ENCOUNTER — Other Ambulatory Visit: Payer: Self-pay | Admitting: Osteopathic Medicine

## 2019-09-28 NOTE — Telephone Encounter (Signed)
Forwarding medication refill request to the clinical pool for review. 

## 2019-10-12 ENCOUNTER — Other Ambulatory Visit: Payer: Self-pay | Admitting: Osteopathic Medicine

## 2019-10-12 DIAGNOSIS — I10 Essential (primary) hypertension: Secondary | ICD-10-CM

## 2019-10-20 ENCOUNTER — Other Ambulatory Visit: Payer: Self-pay | Admitting: Osteopathic Medicine

## 2019-10-20 NOTE — Telephone Encounter (Signed)
Forwarding medication refill request to the clinical pool for review. 

## 2019-12-07 ENCOUNTER — Telehealth: Payer: Self-pay

## 2019-12-07 NOTE — Telephone Encounter (Signed)
Kittie left a vm msg yesterday with concerns about pt's insulin medication. As per Kittie, pt is having care at the [place]. She stated that the pt's insulin was switched and he became very ill. Blood sugar was out of range (300). She was informed as well by a [facility] provider that current insulin that pt was taking could not be override to refill because of provider's note on the rx. She is requesting to speak with you regarding pt's insulin dosing and what pt should be taking. She mentioned that she feels that [place] provider may be lying. Does provider want pt to have a virtual appt to discuss further? Pls advise, thanks.

## 2019-12-08 NOTE — Telephone Encounter (Signed)
Needs an appointment!  I really discourage my [facility] patients from having 2+ doctors manage their diabetes. He needs to either discuss his concerns with the [facility], or tell them that I am managing his diabetes and they are NOT.  Having 2 providers trying to change insulin dosing is dangerous for patients for this reason.  Patient needs a visit with me, can see one of the NP's if needed, or he needs to follow-up with the [facility] for primary care and see me for emergencies only.

## 2019-12-08 NOTE — Telephone Encounter (Signed)
Called daughter back and she states she just needed to know the name of the insulin he was on which is the Toujeo because the [place] said it was not in his notes.  No more further questions. Did advise daughter of MD instructions on picking 1 doctor for his diabetes. KG LPN

## 2019-12-14 ENCOUNTER — Ambulatory Visit: Payer: Medicare Other | Admitting: Osteopathic Medicine

## 2020-03-18 ENCOUNTER — Other Ambulatory Visit: Payer: Self-pay | Admitting: Osteopathic Medicine

## 2020-07-22 IMAGING — CT CT ANGIOGRAPHY CHEST
2 of 6 series · 18 of 46 positions shown · IV contrast (APPLIED)
Comparison: None.

CLINICAL DATA: 89-year-old male with acute shortness of breath and
weakness.

EXAM:
CT ANGIOGRAPHY CHEST WITH CONTRAST
TECHNIQUE: Multidetector CT imaging of the chest was performed using the
standard protocol during bolus administration of intravenous
contrast. Multiplanar CT image reconstructions and MIPs were
obtained to evaluate the vascular anatomy.
CONTRAST:  100mL OMNIPAQUE IOHEXOL 350 MG/ML SOLN

[Series 5: thins · axial · 0.72mm/px · z∈[-284,+6]mm · 16 of 318 slices shown]
[im 14/318  lung]
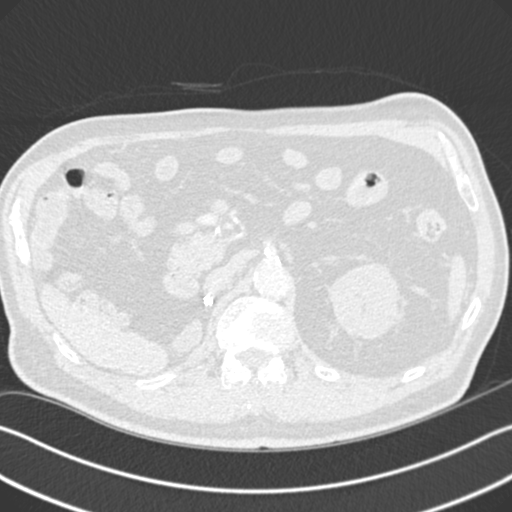
[im 42/318  soft-tissue]
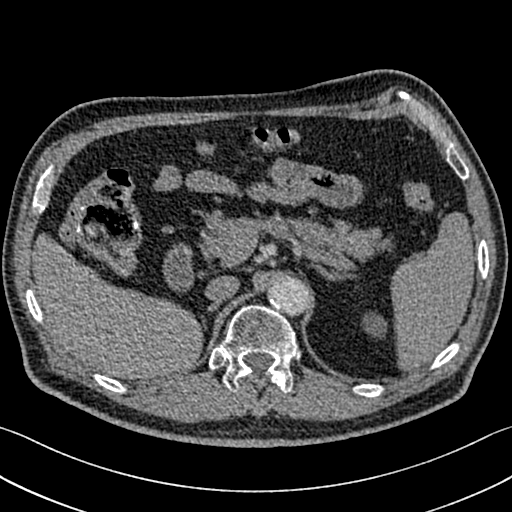
[im 56/318  lung]
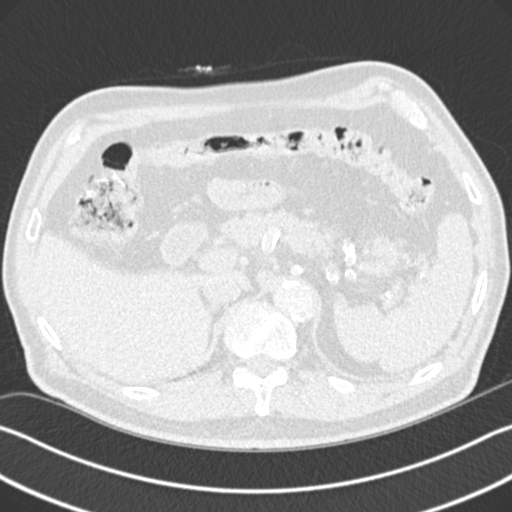
[im 69/318  soft-tissue]
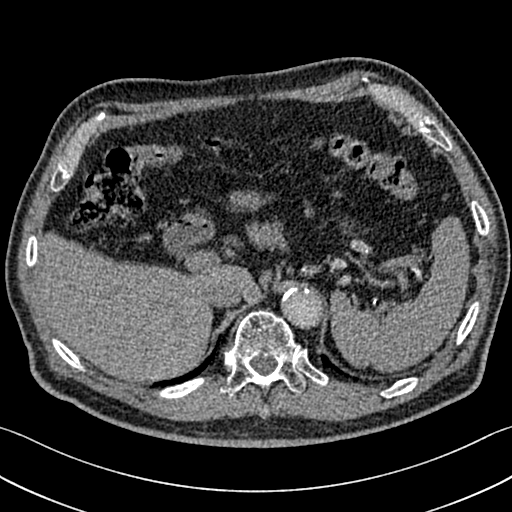
[im 97/318  lung]
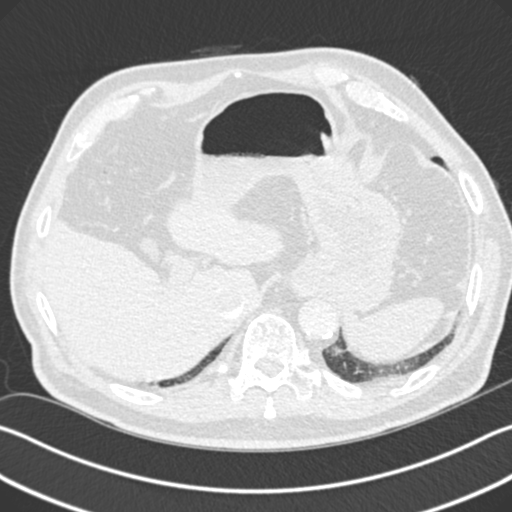
[im 111/318  soft-tissue]
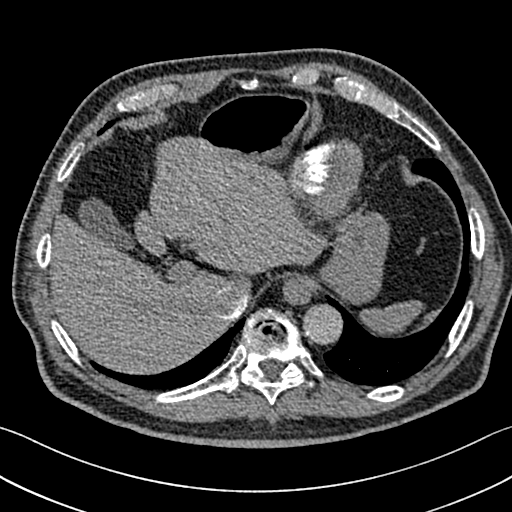
[im 125/318  lung]
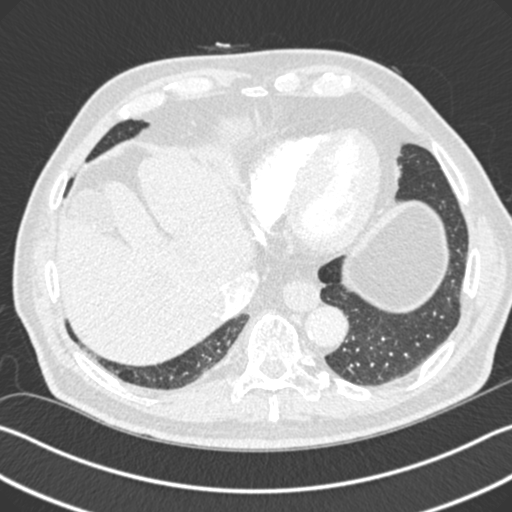
[im 152/318  soft-tissue]
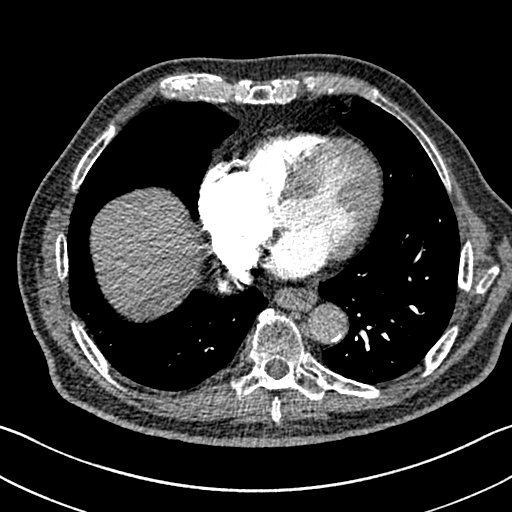
[im 166/318  lung]
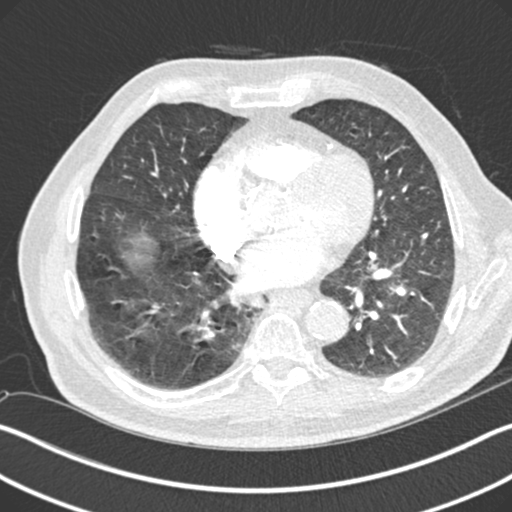
[im 193/318  soft-tissue]
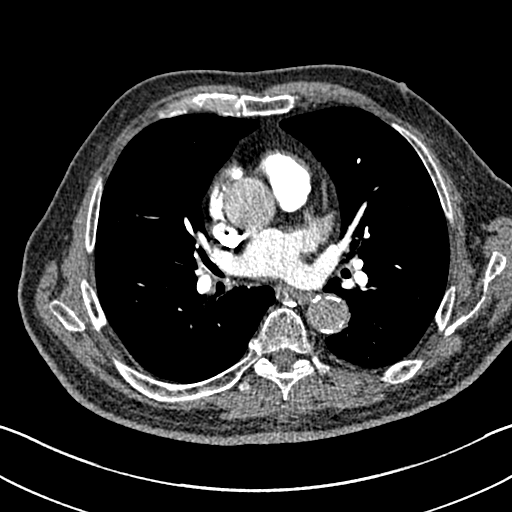
[im 207/318  lung]
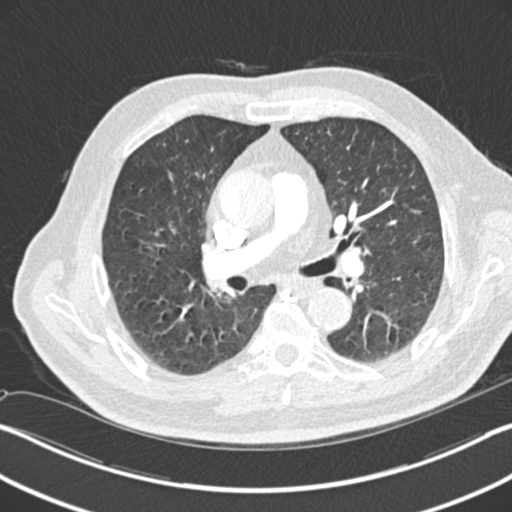
[im 221/318  soft-tissue]
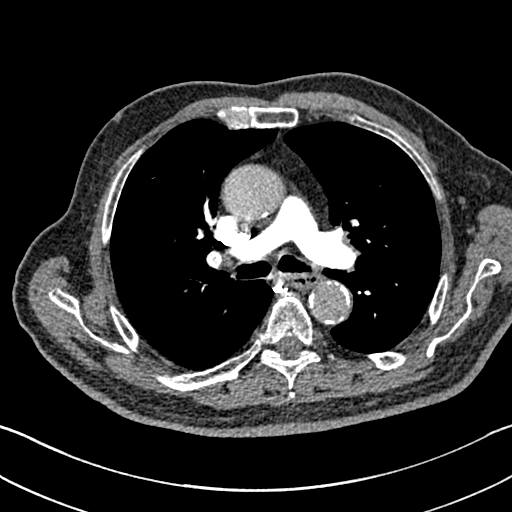
[im 249/318  lung]
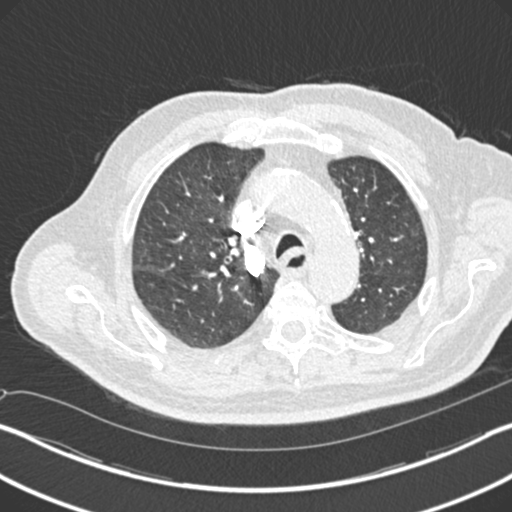
[im 262/318  soft-tissue]
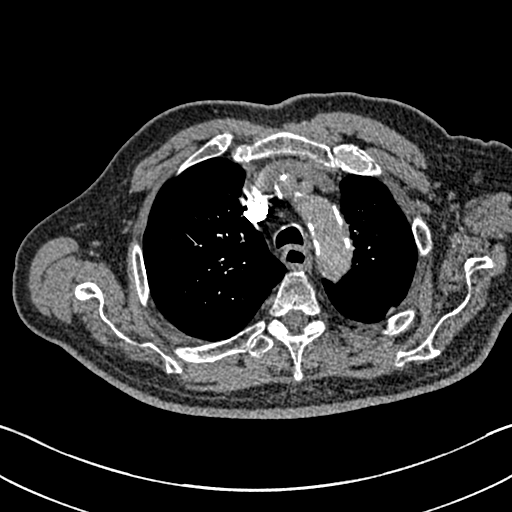
[im 276/318  lung]
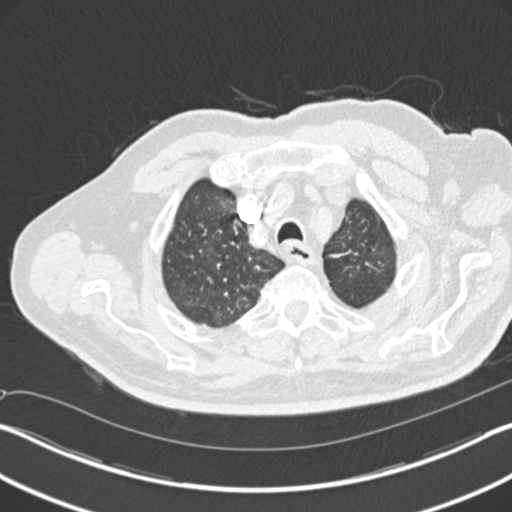
[im 304/318  soft-tissue]
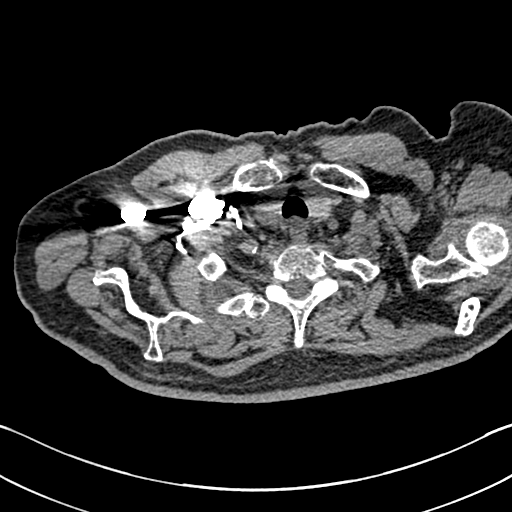

[Series 7: coronal mpr · coronal · 0.63mm/px · 2 of 99 slices shown]
[im 33/99  soft-tissue]
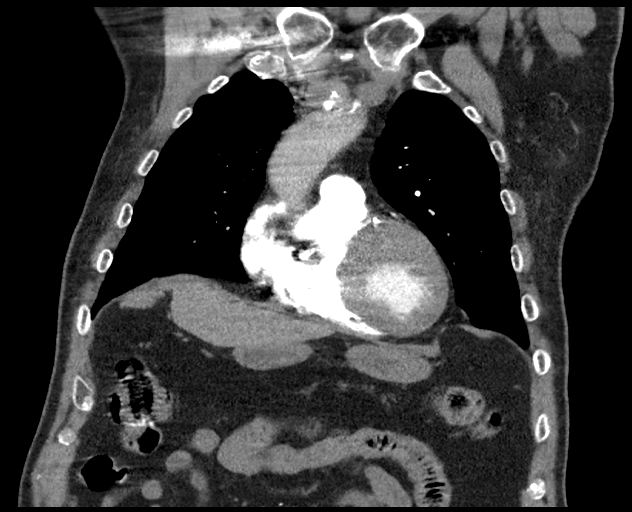
[im 66/99  soft-tissue]
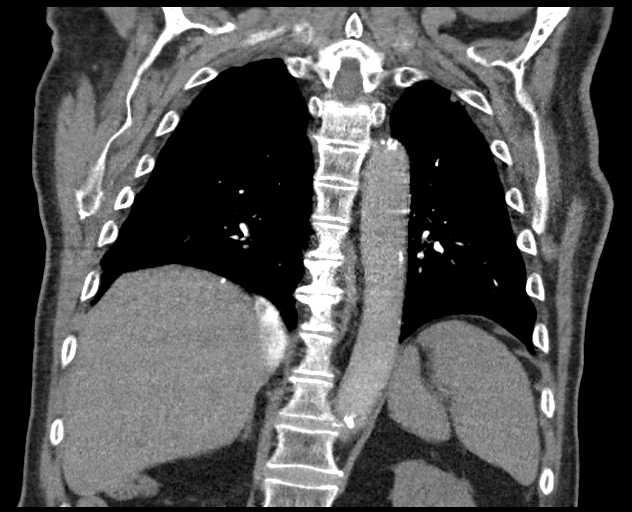

[18 of 46 positions shown; findings below may reference images not displayed]

FINDINGS: Cardiovascular: This is a technically adequate study but respiratory
motion artifact slightly decreases sensitivity in the LOWER lungs.
No pulmonary emboli are identified. UPPER limits normal heart size
noted. Coronary artery and aortic atherosclerotic calcifications are
present. No thoracic aortic aneurysm or pericardial effusion
identified.

Mediastinum/Nodes: No enlarged mediastinal, hilar, or axillary lymph
nodes. Thyroid gland, and trachea demonstrate no significant
findings. Mild circumferential wall thickening of the majority
esophagus is nonspecific but may represent esophagitis.

Lungs/Pleura: No evidence of airspace disease, consolidation,
nodule, mass, pleural effusion or pneumothorax. Minimal biapical
pleuroparenchymal scarring noted.

Upper Abdomen: No acute abnormality. Colonic surgical changes and
RIGHT nephrectomy identified.

Musculoskeletal: No acute or suspicious bony abnormalities.

Review of the MIP images confirms the above findings.
IMPRESSION: 1. No evidence of pulmonary emboli
2. Mild circumferential wall thickening of the visualized esophagus,
question esophagitis.
3. UPPER limits normal heart size, coronary disease and Aortic
Atherosclerosis ([id number]).

## 2020-07-22 IMAGING — DX CHEST - 2 VIEW
2 series · 2 of 2 positions shown · non-contrast
Comparison: PA and lateral chest 01/24/2019.

CLINICAL DATA: Recent onset dyspnea on exertion.

EXAM:
CHEST - 2 VIEW

[chest pa]
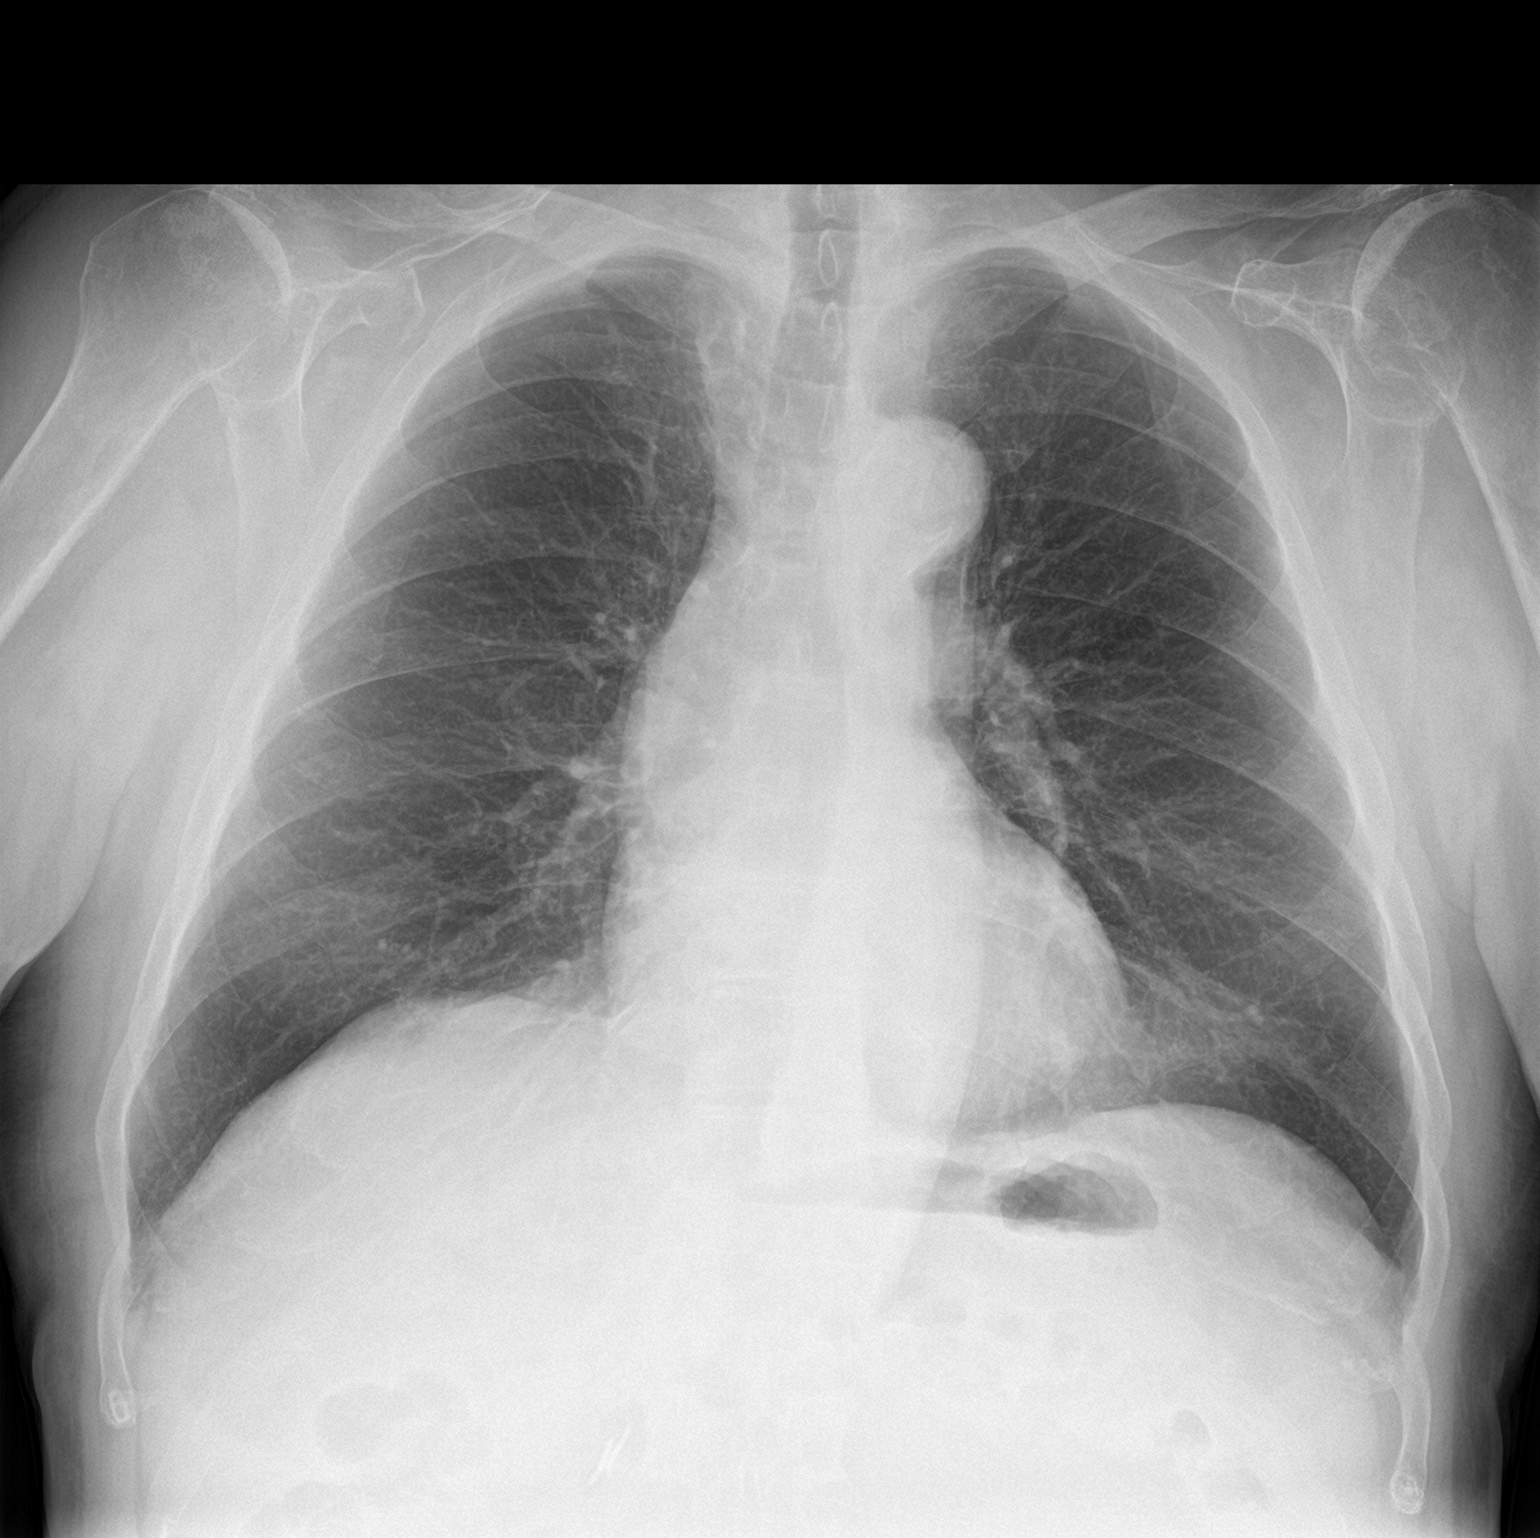

[chest lat]
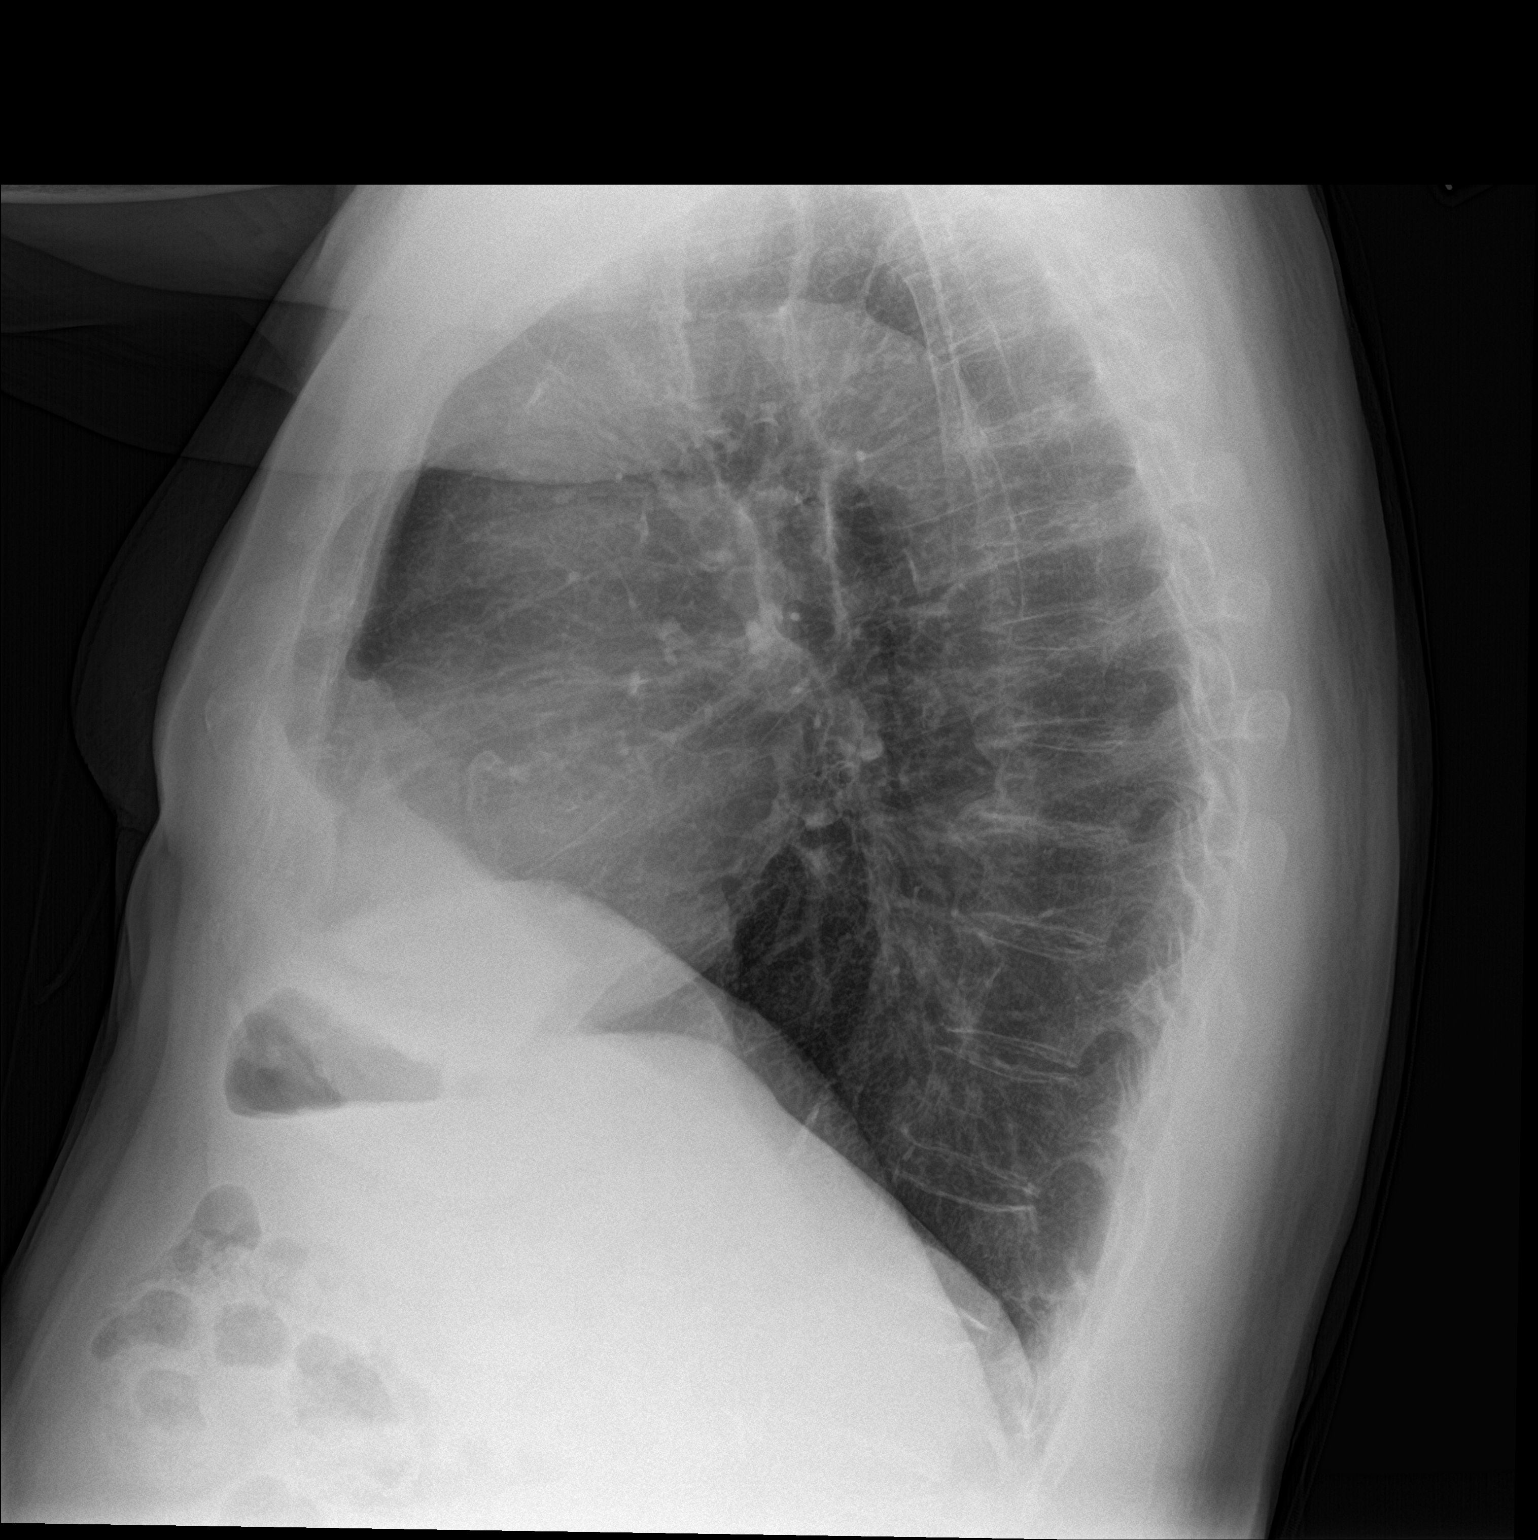

[2 of 2 positions shown; findings below may reference images not displayed]

FINDINGS: Lungs are clear. Aortic atherosclerosis. Heart size is normal. No
pneumothorax or pleural fluid. No acute or focal bony abnormality.
IMPRESSION: No acute disease.

Atherosclerosis.

## 2021-05-12 DEATH — deceased
